# Patient Record
Sex: Male | Born: 1950 | Hispanic: No | Smoking: Never smoker
Health system: Southern US, Community
[De-identification: ages and names within clinical notes are randomized; demographics above are authoritative.]

## PROBLEM LIST (undated history)

## (undated) ENCOUNTER — Emergency Department (HOSPITAL_COMMUNITY): Admission: EM | Payer: Self-pay

## (undated) DIAGNOSIS — I639 Cerebral infarction, unspecified: Secondary | ICD-10-CM

---

## 2021-02-21 ENCOUNTER — Encounter (HOSPITAL_COMMUNITY): Payer: Self-pay | Admitting: Emergency Medicine

## 2021-02-21 ENCOUNTER — Inpatient Hospital Stay (HOSPITAL_COMMUNITY)
Admission: EM | Admit: 2021-02-21 | Discharge: 2021-02-25 | DRG: 069 | Disposition: A | Discharge status: Home Health | Payer: Medicare Other | Attending: Internal Medicine | Admitting: Internal Medicine

## 2021-02-21 ENCOUNTER — Other Ambulatory Visit: Payer: Self-pay

## 2021-02-21 ENCOUNTER — Emergency Department (HOSPITAL_COMMUNITY): Payer: Medicare Other

## 2021-02-21 DIAGNOSIS — E440 Moderate protein-calorie malnutrition: Secondary | ICD-10-CM | POA: Insufficient documentation

## 2021-02-21 DIAGNOSIS — B20 Human immunodeficiency virus [HIV] disease: Secondary | ICD-10-CM

## 2021-02-21 DIAGNOSIS — I639 Cerebral infarction, unspecified: Principal | ICD-10-CM

## 2021-02-21 DIAGNOSIS — R2990 Unspecified symptoms and signs involving the nervous system: Secondary | ICD-10-CM | POA: Diagnosis present

## 2021-02-21 DIAGNOSIS — G459 Transient cerebral ischemic attack, unspecified: Secondary | ICD-10-CM

## 2021-02-21 DIAGNOSIS — E041 Nontoxic single thyroid nodule: Secondary | ICD-10-CM

## 2021-02-21 DIAGNOSIS — I10 Essential (primary) hypertension: Secondary | ICD-10-CM | POA: Diagnosis present

## 2021-02-21 DIAGNOSIS — Z79899 Other long term (current) drug therapy: Secondary | ICD-10-CM

## 2021-02-21 DIAGNOSIS — I69354 Hemiplegia and hemiparesis following cerebral infarction affecting left non-dominant side: Secondary | ICD-10-CM

## 2021-02-21 DIAGNOSIS — Z7901 Long term (current) use of anticoagulants: Secondary | ICD-10-CM

## 2021-02-21 DIAGNOSIS — I951 Orthostatic hypotension: Secondary | ICD-10-CM | POA: Diagnosis present

## 2021-02-21 DIAGNOSIS — Z21 Asymptomatic human immunodeficiency virus [HIV] infection status: Secondary | ICD-10-CM | POA: Diagnosis present

## 2021-02-21 DIAGNOSIS — Z20822 Contact with and (suspected) exposure to covid-19: Secondary | ICD-10-CM | POA: Diagnosis present

## 2021-02-21 DIAGNOSIS — E042 Nontoxic multinodular goiter: Secondary | ICD-10-CM | POA: Diagnosis present

## 2021-02-21 DIAGNOSIS — E43 Unspecified severe protein-calorie malnutrition: Secondary | ICD-10-CM | POA: Diagnosis present

## 2021-02-21 DIAGNOSIS — Z6822 Body mass index (BMI) 22.0-22.9, adult: Secondary | ICD-10-CM

## 2021-02-21 DIAGNOSIS — R29810 Facial weakness: Secondary | ICD-10-CM | POA: Diagnosis present

## 2021-02-21 DIAGNOSIS — R1310 Dysphagia, unspecified: Secondary | ICD-10-CM | POA: Diagnosis present

## 2021-02-21 DIAGNOSIS — I480 Paroxysmal atrial fibrillation: Secondary | ICD-10-CM | POA: Diagnosis present

## 2021-02-21 DIAGNOSIS — Z7982 Long term (current) use of aspirin: Secondary | ICD-10-CM

## 2021-02-21 DIAGNOSIS — R270 Ataxia, unspecified: Secondary | ICD-10-CM | POA: Diagnosis present

## 2021-02-21 HISTORY — DX: Cerebral infarction, unspecified: I63.9

## 2021-02-21 LAB — COMPREHENSIVE METABOLIC PANEL
ALT: 23 U/L (ref 0–44)
AST: 37 U/L (ref 15–41)
Albumin: 3.9 g/dL (ref 3.5–5.0)
Alkaline Phosphatase: 71 U/L (ref 38–126)
Anion gap: 7 (ref 5–15)
BUN: 23 mg/dL (ref 8–23)
CO2: 24 mmol/L (ref 22–32)
Calcium: 9.2 mg/dL (ref 8.9–10.3)
Chloride: 106 mmol/L (ref 98–111)
Creatinine, Ser: 1.25 mg/dL — ABNORMAL HIGH (ref 0.61–1.24)
GFR, Estimated: >60 mL/min (ref >60)
Glucose, Bld: 192 mg/dL — ABNORMAL HIGH (ref 70–99)
Potassium: 4.2 mmol/L (ref 3.5–5.1)
Sodium: 137 mmol/L (ref 135–145)
Total Bilirubin: 1 mg/dL (ref 0.3–1.2)
Total Protein: 7.7 g/dL (ref 6.5–8.1)

## 2021-02-21 LAB — I-STAT CHEM 8, ED
BUN: 27 mg/dL — ABNORMAL HIGH (ref 8–23)
Calcium, Ion: 1.1 mmol/L — ABNORMAL LOW (ref 1.15–1.40)
Chloride: 107 mmol/L (ref 98–111)
Creatinine, Ser: 1 mg/dL (ref 0.61–1.24)
Glucose, Bld: 193 mg/dL — ABNORMAL HIGH (ref 70–99)
HCT: 47 % (ref 39.0–52.0)
Hemoglobin: 16 g/dL (ref 13.0–17.0)
Potassium: 4.4 mmol/L (ref 3.5–5.1)
Sodium: 141 mmol/L (ref 135–145)
TCO2: 24 mmol/L (ref 22–32)

## 2021-02-21 LAB — CBC
HCT: 45.5 % (ref 39.0–52.0)
Hemoglobin: 14.7 g/dL (ref 13.0–17.0)
MCH: 33.9 pg (ref 26.0–34.0)
MCHC: 32.3 g/dL (ref 30.0–36.0)
MCV: 105.1 fL — ABNORMAL HIGH (ref 80.0–100.0)
Platelets: 193 10*3/uL (ref 150–400)
RBC: 4.33 MIL/uL (ref 4.22–5.81)
RDW: 12.9 % (ref 11.5–15.5)
WBC: 4.7 10*3/uL (ref 4.0–10.5)
nRBC: 0 % (ref 0.0–0.2)

## 2021-02-21 LAB — APTT: aPTT: 34 seconds (ref 24–36)

## 2021-02-21 LAB — DIFFERENTIAL
Abs Immature Granulocytes: 0.01 10*3/uL (ref 0.00–0.07)
Basophils Absolute: 0 10*3/uL (ref 0.0–0.1)
Basophils Relative: 1 %
Eosinophils Absolute: 0.1 10*3/uL (ref 0.0–0.5)
Eosinophils Relative: 1 %
Immature Granulocytes: 0 %
Lymphocytes Relative: 24 %
Lymphs Abs: 1.1 10*3/uL (ref 0.7–4.0)
Monocytes Absolute: 0.4 10*3/uL (ref 0.1–1.0)
Monocytes Relative: 9 %
Neutro Abs: 3.1 10*3/uL (ref 1.7–7.7)
Neutrophils Relative %: 65 %

## 2021-02-21 LAB — CBG MONITORING, ED
Glucose-Capillary: 178 mg/dL — ABNORMAL HIGH (ref 70–99)
Glucose-Capillary: 190 mg/dL — ABNORMAL HIGH (ref 70–99)

## 2021-02-21 LAB — RESP PANEL BY RT-PCR (FLU A&B, COVID) ARPGX2
Influenza A by PCR: NEGATIVE
Influenza B by PCR: NEGATIVE
SARS Coronavirus 2 by RT PCR: NEGATIVE

## 2021-02-21 LAB — PROTIME-INR
INR: 1.5 — ABNORMAL HIGH (ref 0.8–1.2)
Prothrombin Time: 17.6 seconds — ABNORMAL HIGH (ref 11.4–15.2)

## 2021-02-21 IMAGING — CT CT HEAD CODE STROKE
4 series · 16 of 47 positions shown, 18 images · non-contrast
Comparison: None.

CLINICAL DATA: Code stroke.  Left arm, right face

EXAM:
CT HEAD WITHOUT CONTRAST
TECHNIQUE: Contiguous axial images were obtained from the base of the skull
through the vertex without intravenous contrast.

[Series 2: head wo · axial · 0.40mm/px · z∈[-124,-4]mm · 7 of 33 slices shown, 9 images]
[im 5/33  brain]
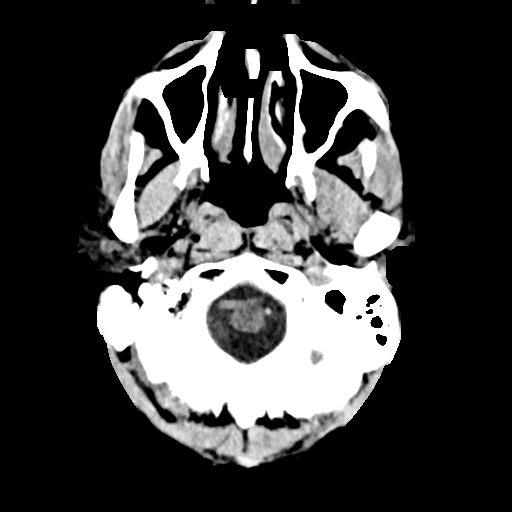
[im 5/33  bone]
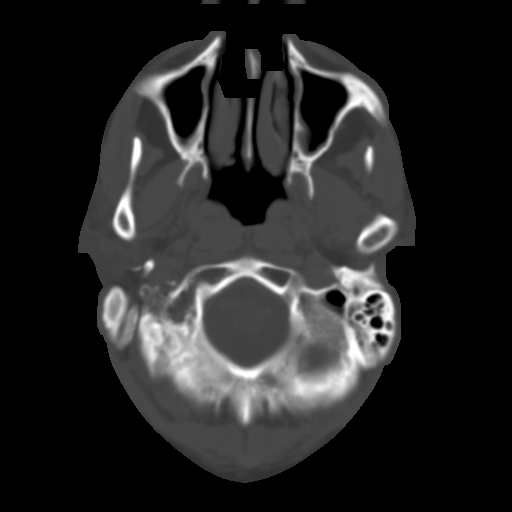
[im 9/33  brain]
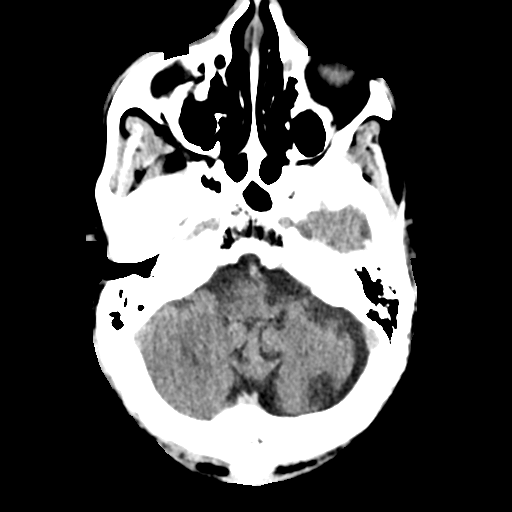
[im 13/33  brain]
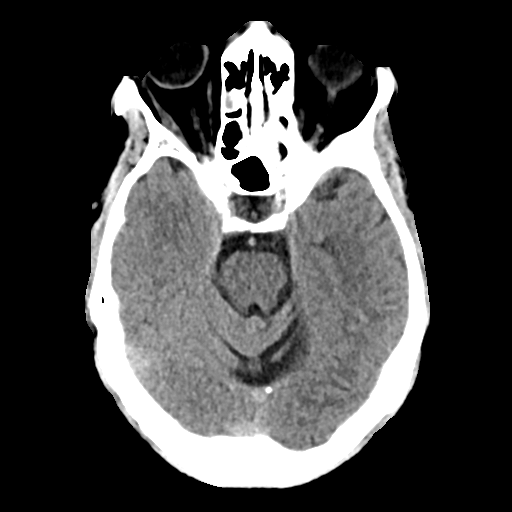
[im 17/33  brain]
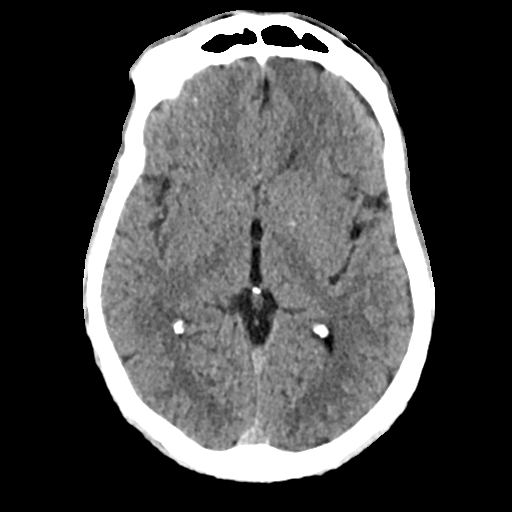
[im 21/33  brain]
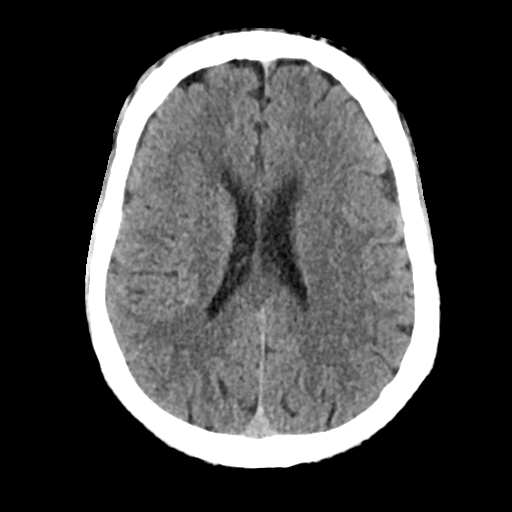
[im 21/33  bone]
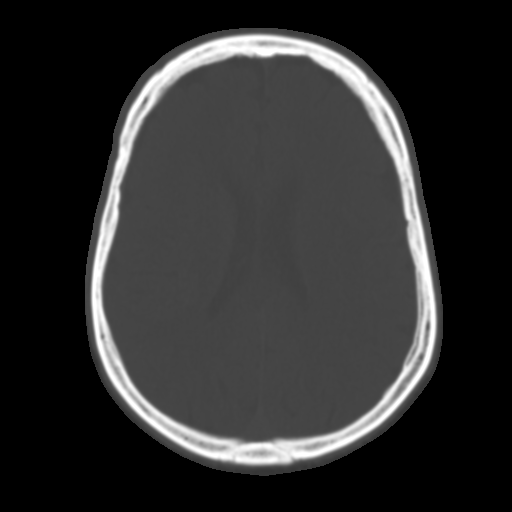
[im 25/33  brain]
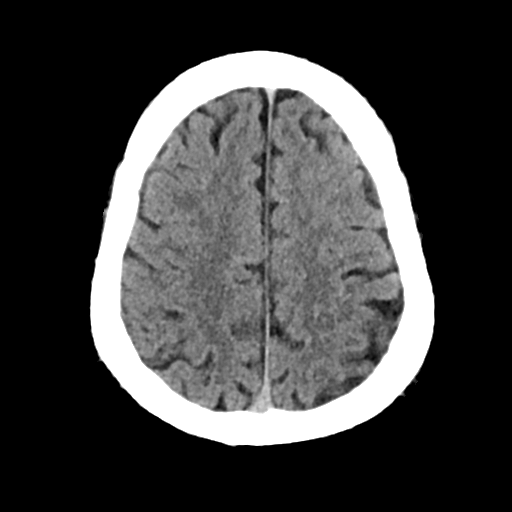
[im 29/33  brain]
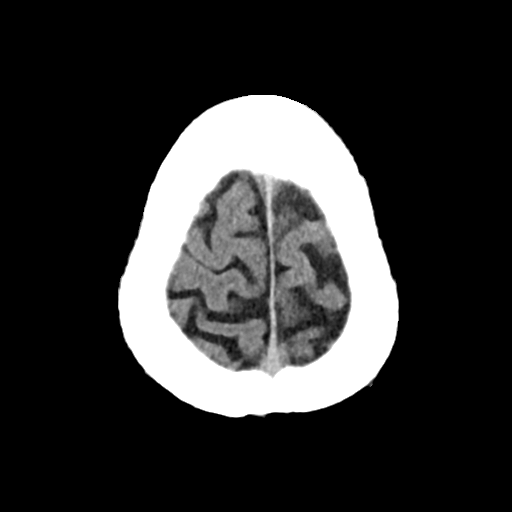

[Series 4: head bone · axial · 0.40mm/px · z∈[-128,-96]mm · 3 of 82 slices shown]
[im 9/82  bone]
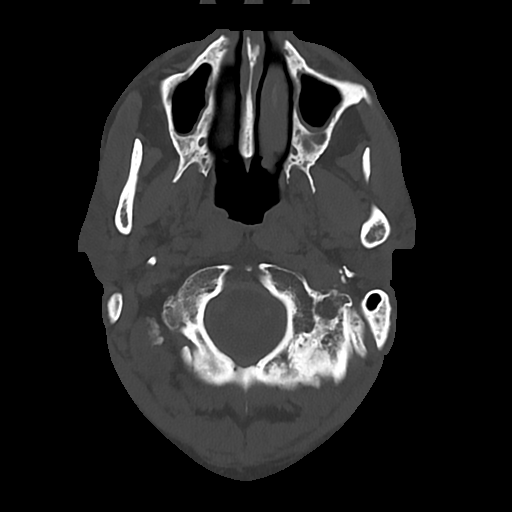
[im 17/82  bone]
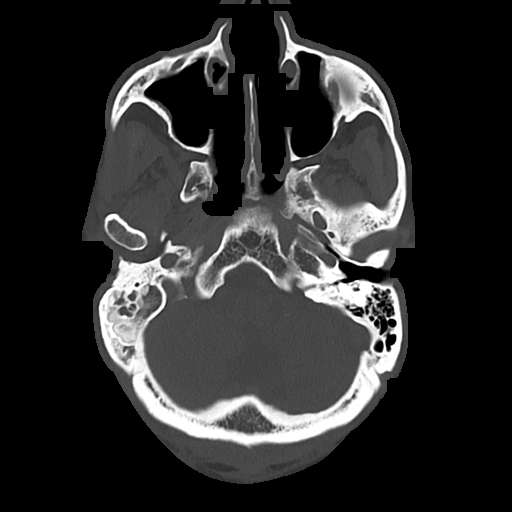
[im 25/82  bone]
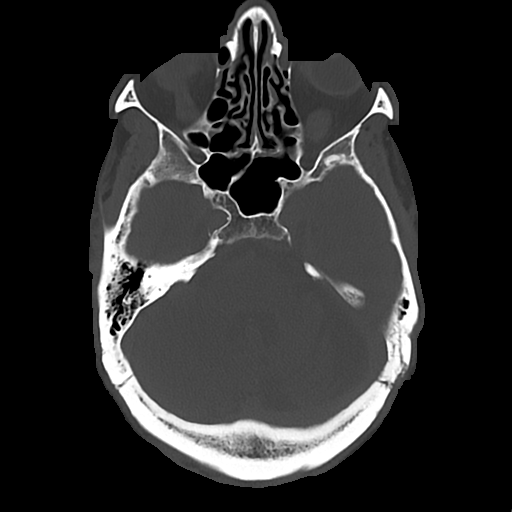

[Series 5: cor soft · coronal · 0.33mm/px · 3 of 73 slices shown]
[im 25/73  brain]
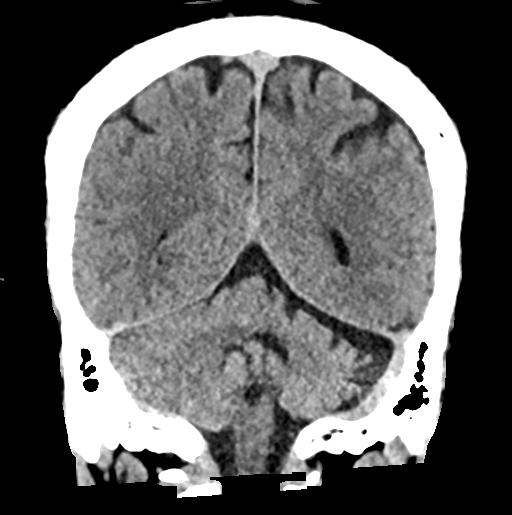
[im 33/73  brain]
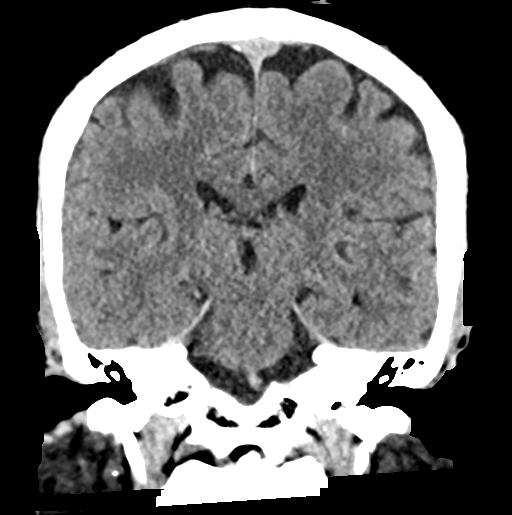
[im 41/73  brain]
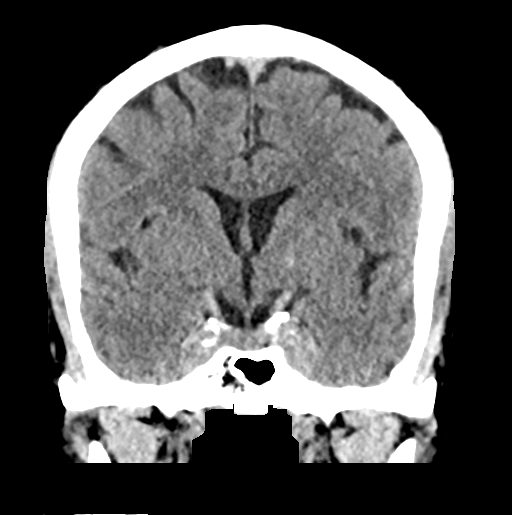

[Series 6: sag soft · sagittal · 0.33mm/px · 3 of 55 slices shown]
[im 19/55  brain]
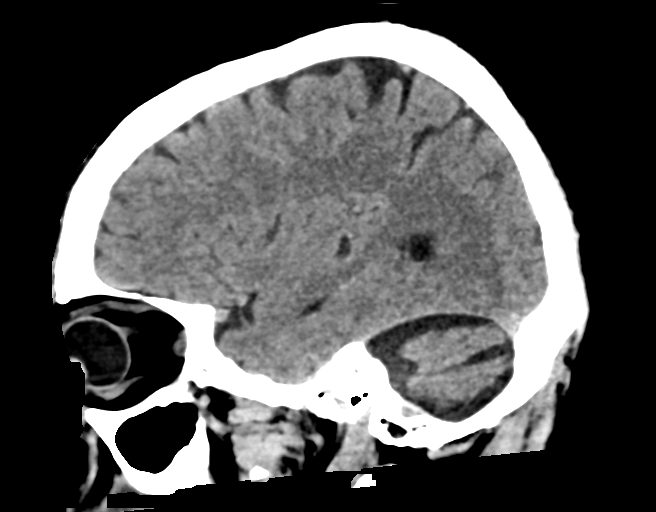
[im 28/55  brain]
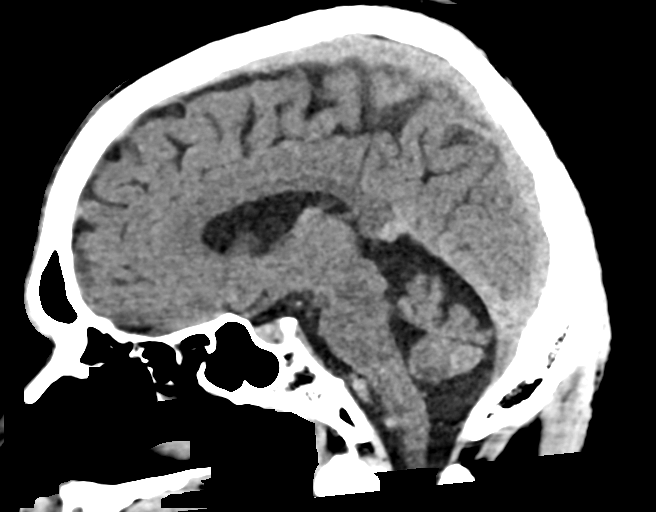
[im 37/55  brain]
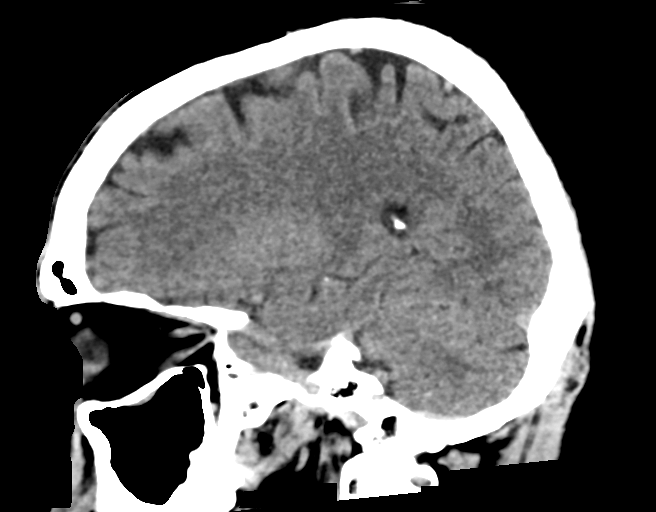

[16 of 47 positions shown; findings below may reference images not displayed]

FINDINGS: Brain: No evidence of acute infarction, hemorrhage, cerebral edema,
mass, mass effect, or midline shift. Ventricles and sulci are normal
for age. No extra-axial fluid collection.

Vascular: No hyperdense vessel or unexpected calcification.

Skull: Normal. Negative for fracture or focal lesion.

Sinuses/Orbits: Mucosal thickening in the ethmoid air cells. The
orbits are unremarkable.

Other: None.

ASPECTS ([place] Stroke Program Early CT Score)

- Ganglionic level infarction (caudate, lentiform nuclei, internal
capsule, insula, M1-M3 cortex): 7

- Supraganglionic infarction (M4-M6 cortex): 3

Total score (0-10 with 10 being normal): 10
IMPRESSION: 1. No acute intracranial process.
2. ASPECTS is 10.

Code stroke imaging results were communicated on [DATE] at [DATE] to provider RRULI via secure text paging.

## 2021-02-21 MED ORDER — SODIUM CHLORIDE 0.9% FLUSH
3.0000 mL | Freq: Once | INTRAVENOUS | Status: AC
Start: 2021-02-21 — End: 2021-02-21
  Administered 2021-02-21: 3 mL via INTRAVENOUS

## 2021-02-21 NOTE — ED Provider Notes (Signed)
[facility] EMERGENCY DEPARTMENT Provider Note   CSN: [id number] Arrival date & time: 02/21/21  1858  An emergency department physician performed an initial assessment on this suspected stroke patient at 1858.  History Chief Complaint  Patient presents with   Code Stroke    Zachary Tucker is a 70 y.o. male.  Patient last known normal was 1800.  Patient is left-handed.  Patient was on a conference call when he noted that the symptoms.  He had right facial droop difficulty speaking and loss of coordination.  Attendees stated that there was right facial droop.  EMS was called.  On arrival here patient was showing significant improvement.  There was no signs of aphasia.  Facial droop had cleared.  There was some coordination very subtle problems in the left upper extremity.  Patient arrived as a code stroke and was seen by the stroke team.  Including Dr. Thomasena Edis.      Past Medical History:  Diagnosis Date   Stroke [facility])     There are no problems to display for this patient.   History reviewed. No pertinent surgical history.     No family history on file.  Social History   Tobacco Use   Smoking status: Never   Smokeless tobacco: Never  Substance Use Topics   Alcohol use: Never   Drug use: Never    Home Medications Prior to Admission medications   Not on File    Allergies    Patient has no known allergies.  Review of Systems   Review of Systems  Constitutional:  Negative for chills and fever.  HENT:  Negative for ear pain and sore throat.   Eyes:  Negative for pain and visual disturbance.  Respiratory:  Negative for cough and shortness of breath.   Cardiovascular:  Negative for chest pain and palpitations.  Gastrointestinal:  Negative for abdominal pain and vomiting.  Genitourinary:  Negative for dysuria and hematuria.  Musculoskeletal:  Negative for arthralgias and back pain.  Skin:  Negative for color change and rash.  Neurological:   Positive for facial asymmetry. Negative for seizures and syncope.  All other systems reviewed and are negative.  Physical Exam Updated Vital Signs BP 130/90   Pulse (!) 58   Temp 98.1 F (36.7 C) (Temporal)   Resp 16   Ht 1.956 m (6\' 5" )   Wt 86 kg   SpO2 98%   BMI 22.48 kg/m   Physical Exam Vitals and nursing note reviewed.  Constitutional:      Appearance: Normal appearance. He is well-developed.  HENT:     Head: Normocephalic and atraumatic.  Eyes:     Extraocular Movements: Extraocular movements intact.     Conjunctiva/sclera: Conjunctivae normal.     Pupils: Pupils are equal, round, and reactive to light.  Cardiovascular:     Rate and Rhythm: Normal rate and regular rhythm.     Heart sounds: No murmur heard. Pulmonary:     Effort: Pulmonary effort is normal. No respiratory distress.     Breath sounds: Normal breath sounds.  Abdominal:     Palpations: Abdomen is soft.     Tenderness: There is no abdominal tenderness.  Musculoskeletal:        General: Normal range of motion.     Cervical back: Neck supple.  Skin:    General: Skin is warm and dry.  Neurological:     Mental Status: He is alert and oriented to person, place, and time.  Cranial Nerves: No cranial nerve deficit.     Sensory: No sensory deficit.     Motor: No weakness.     Coordination: Coordination abnormal.    ED Results / Procedures / Treatments   Labs (all labs ordered are listed, but only abnormal results are displayed) Labs Reviewed  PROTIME-INR - Abnormal; Notable for the following components:      Result Value   Prothrombin Time 17.6 (*)    INR 1.5 (*)    All other components within normal limits  CBC - Abnormal; Notable for the following components:   MCV 105.1 (*)    All other components within normal limits  COMPREHENSIVE METABOLIC PANEL - Abnormal; Notable for the following components:   Glucose, Bld 192 (*)    Creatinine, Ser 1.25 (*)    All other components within normal  limits  I-STAT CHEM 8, ED - Abnormal; Notable for the following components:   BUN 27 (*)    Glucose, Bld 193 (*)    Calcium, Ion 1.10 (*)    All other components within normal limits  CBG MONITORING, ED - Abnormal; Notable for the following components:   Glucose-Capillary 190 (*)    All other components within normal limits  CBG MONITORING, ED - Abnormal; Notable for the following components:   Glucose-Capillary 178 (*)    All other components within normal limits  RESP PANEL BY RT-PCR (FLU A&B, COVID) ARPGX2  APTT  DIFFERENTIAL    EKG EKG Interpretation  Date/Time:  Thursday February 21 2021 19:26:29 EDT Ventricular Rate:  63 PR Interval:  175 QRS Duration: 88 QT Interval:  422 QTC Calculation: 432 R Axis:   -10 Text Interpretation: Sinus rhythm ST elevation, consider inferior injury No previous ECGs available Confirmed by Vanetta Mulders [phone]) on 02/21/2021 10:12:37 PM  Radiology CT HEAD CODE STROKE WO CONTRAST  Result Date: 02/21/2021 CLINICAL DATA:  Code stroke.  Left arm, right face EXAM: CT HEAD WITHOUT CONTRAST TECHNIQUE: Contiguous axial images were obtained from the base of the skull through the vertex without intravenous contrast. COMPARISON:  None. FINDINGS: Brain: No evidence of acute infarction, hemorrhage, cerebral edema, mass, mass effect, or midline shift. Ventricles and sulci are normal for age. No extra-axial fluid collection. Vascular: No hyperdense vessel or unexpected calcification. Skull: Normal. Negative for fracture or focal lesion. Sinuses/Orbits: Mucosal thickening in the ethmoid air cells. The orbits are unremarkable. Other: None. ASPECTS [facility] Stroke Program Early CT Score) - Ganglionic level infarction (caudate, lentiform nuclei, internal capsule, insula, M1-M3 cortex): 7 - Supraganglionic infarction (M4-M6 cortex): 3 Total score (0-10 with 10 being normal): 10 IMPRESSION: 1. No acute intracranial process. 2. ASPECTS is 10. Code stroke imaging  results were communicated on 02/21/2021 at 7:18 pm to provider [facility] via secure text paging. Electronically Signed   By: Wiliam Ke M.D.   On: 02/21/2021 19:19    Procedures Procedures   Medications Ordered in ED Medications  sodium chloride flush (NS) 0.9 % injection 3 mL (3 mLs Intravenous Given 02/21/21 1931)    ED Course  I have reviewed the triage vital signs and the nursing notes.  Pertinent labs & imaging results that were available during my care of the patient were reviewed by me and considered in my medical decision making (see chart for details).    MDM Rules/Calculators/A&P                         CRITICAL CARE Performed by: Lorin Picket  Decoda Van Total critical care time: 45 minutes Critical care time was exclusive of separately billable procedures and treating other patients. Critical care was necessary to treat or prevent imminent or life-threatening deterioration. Critical care was time spent personally by me on the following activities: development of treatment plan with patient and/or surrogate as well as nursing, discussions with consultants, evaluation of patient's response to treatment, examination of patient, obtaining history from patient or surrogate, ordering and performing treatments and interventions, ordering and review of laboratory studies, ordering and review of radiographic studies, pulse oximetry and re-evaluation of patient's condition.   Patient now with resolution of all of his symptoms except for may be some coordination abnormalities in the upper extremity.  Seen by stroke team.  They recommend medical admission MRI MRA head and neck.  These have been ordered.  Discussed with the hospitalist service they will admit.  Since initial COVID testing and influenza testing negative.  Glucose was 178.  CBC without acute abnormalities.  Complete metabolic panel creatinine was 1.63.  CT head without any acute process.    Final Clinical Impression(s) / ED  Diagnoses Final diagnoses:  Cerebrovascular accident (CVA), unspecified mechanism [facility])    Rx / DC Orders ED Discharge Orders     None        Vanetta Mulders, MD 02/21/21 2322

## 2021-02-21 NOTE — Consult Note (Signed)
Neurology Consult H&P  Zachary Tucker MR# [id number] 02/21/2021   CC: R facial droop, difficulty speaking, and loss of coordination. Pt was on a conference call when he was noted to have these symptoms and EMS was called.  History is obtained from: Patient and chart.  HPI: Zachary Tucker is a left-handed 70 y.o. male PMHx as reviewed below was on a conference call and when the attendees noted right-sided facial droop and called EMS.  Reviewed his medical record shows that he is on aspirin 81 and was on apixaban which was temporarily stopped for dental procedure and restarted this morning.  He took his morning dose.  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  LKW: 1800 tNK given: No not a candidate IR Thrombectomy No, no LVO Modified Rankin Scale: 0-Completely asymptomatic and back to baseline post- stroke NIHSS: 2  ROS: A complete ROS was performed and is negative except as noted in the HPI.   No past medical history on file.   No family history on file.  Social History:  has no history on file for tobacco use, alcohol use, and drug use.   Prior to Admission medications   Not on File    Exam: Current vital signs: There were no vitals taken for this visit.  Physical Exam  Constitutional: Appears well-developed and well-nourished.  Psych: Affect appropriate to situation Eyes: No scleral injection HENT: No OP obstruction. Head: Normocephalic.  Cardiovascular: Normal rate and regular rhythm.  Respiratory: Effort normal, symmetric excursions bilaterally, no audible wheezing. GI: Soft.  No distension. There is no tenderness.  Skin: WDI  Neuro: Mental Status: Patient is awake, alert, oriented to person, place, month, year, and situation. Patient is able to give a clear and coherent history. Speech fluent, intact comprehension and repetition. No signs of aphasia or neglect. Visual Fields are full. Pupils  are equal, round, and reactive to light. EOMI without ptosis or diploplia.  Facial sensation is symmetric to temperature Facial movement is symmetric.  Hearing is intact to voice. Uvula midline and palate elevates symmetrically. Shoulder shrug is symmetric. Tongue is midline without atrophy or fasciculations.  Tone is normal. Bulk is normal. 5/5 strength was present in all four extremities. Sensation mild decreased to light touch in the larm. Deep Tendon Reflexes: 2+ and symmetric in the biceps and patellae. Toes are downgoing bilaterally. FNF mildly ataxic in the left upper extremity. HKS are intact bilaterally. Gait - Deferred  I have reviewed labs in epic and the pertinent results are: None available at the time of evaluation  I have reviewed the images obtained: NCT head showed no acute ischemic changes hemorrhage or mass, aspects 10  Assessment: Zachary Tucker is a left-handed 70 y.o. male PMHx as noted above on aspirin and apixaban with acute onset and transient mild right facial droop and incoordination on the left.  On exam he does not have facial droop and there is no aphasia.  He does have mild ataxia in the left upper extremity.  He was not a candidate for tNK.  Plan: - MRI brain without contrast. - Recommend vascular imaging with MRA head and neck. - Recommend TTE. - Recommend labs: HbA1c, lipid panel, TSH. - Recommend Statin if LDL > 70 -He is on aspirin 81mg  daily presumably from cardiology. - Continue apixaban - Permissive hypertension first 24 h < 220/110.  - Telemetry monitoring for arrhythmia. - Recommend bedside Swallow screen. - Recommend Stroke education. -  Recommend PT/OT/SLP consult.  This patient is critically ill and at significant risk of neurological worsening, death and care requires constant monitoring of vital signs, hemodynamics,respiratory and cardiac monitoring, neurological assessment, discussion with family, other specialists and medical decision  making of high complexity. I spent 73 minutes of neurocritical care time  in the care of  this patient. This was time spent independent of any time provided by nurse practitioner or PA.  Electronically signed by:  Marisue Humble, MD Page: [phone] 02/21/2021, 7:21 PM

## 2021-02-21 NOTE — ED Triage Notes (Signed)
Patient arrived with EMS from home code stroke status , LSN  today at 6 pm with sudden onset right facial droop with left arm weakness and expressive aphasia .

## 2021-02-21 NOTE — Code Documentation (Addendum)
Responded to Code Stroke called at 1839 for L sided weakness, R facial droop, difficulty speaking, and loss of coordination, LSN-1800. Pt was on a conference call when he was noted to have these symptoms and EMS was called. Pt arrived to ED at 1858, CBG-190, NIH-2 for ataxia and sensory deficits, CTH-negative for acute changes. TNK not given-pt on eliquis. Per pt, eliquis had been stopped for short time for a dental surgery. Pt restarted it today. Plan for MRI.

## 2021-02-22 ENCOUNTER — Observation Stay (HOSPITAL_COMMUNITY): Payer: Medicare Other

## 2021-02-22 ENCOUNTER — Encounter (HOSPITAL_COMMUNITY): Payer: Self-pay | Admitting: Internal Medicine

## 2021-02-22 DIAGNOSIS — E440 Moderate protein-calorie malnutrition: Secondary | ICD-10-CM | POA: Insufficient documentation

## 2021-02-22 DIAGNOSIS — B20 Human immunodeficiency virus [HIV] disease: Secondary | ICD-10-CM

## 2021-02-22 DIAGNOSIS — G459 Transient cerebral ischemic attack, unspecified: Secondary | ICD-10-CM

## 2021-02-22 DIAGNOSIS — E041 Nontoxic single thyroid nodule: Secondary | ICD-10-CM

## 2021-02-22 DIAGNOSIS — I480 Paroxysmal atrial fibrillation: Secondary | ICD-10-CM

## 2021-02-22 DIAGNOSIS — Z21 Asymptomatic human immunodeficiency virus [HIV] infection status: Secondary | ICD-10-CM

## 2021-02-22 DIAGNOSIS — I951 Orthostatic hypotension: Secondary | ICD-10-CM | POA: Diagnosis not present

## 2021-02-22 DIAGNOSIS — Z8673 Personal history of transient ischemic attack (TIA), and cerebral infarction without residual deficits: Secondary | ICD-10-CM

## 2021-02-22 LAB — ECHOCARDIOGRAM COMPLETE
Area-P 1/2: 2.01 cm2
Height: 77 in
S' Lateral: 2.5 cm
Weight: 3033.53 oz

## 2021-02-22 LAB — RAPID URINE DRUG SCREEN, HOSP PERFORMED
Amphetamines: NOT DETECTED
Barbiturates: NOT DETECTED
Benzodiazepines: POSITIVE — AB
Cocaine: NOT DETECTED
Opiates: NOT DETECTED
Tetrahydrocannabinol: NOT DETECTED

## 2021-02-22 LAB — TSH: TSH: 2.135 u[IU]/mL (ref 0.350–4.500)

## 2021-02-22 LAB — LIPID PANEL
Cholesterol: 115 mg/dL (ref 0–200)
HDL: 33 mg/dL — ABNORMAL LOW (ref >40)
LDL Cholesterol: 69 mg/dL (ref 0–99)
Total CHOL/HDL Ratio: 3.5 RATIO
Triglycerides: 64 mg/dL (ref <150)
VLDL: 13 mg/dL (ref 0–40)

## 2021-02-22 IMAGING — MR MR MRA NECK WO/W CM
5 of 9 series · 35 of 48 positions shown · IV contrast (gadavist)
Comparison: Brain MRI and intracranial MRA today.

CLINICAL DATA: 64-year-old male code stroke presentation. Left arm
and right face deficits.

EXAM:
MRA NECK WITHOUT AND WITH CONTRAST
TECHNIQUE: Multiplanar and multiecho pulse sequences of the neck were obtained
without and with intravenous contrast. Angiographic images of the
neck were obtained using MRA technique without and with intravenous
contrast.
CONTRAST:  8mL GADAVIST GADOBUTROL 1 MMOL/ML IV SOLN

[Series 10: angio_fl3d_cor_pre_ttc=3.0s · coronal · 0.9mm · 0.88mm/px · 9 of 80 slices shown]
[im 1/80]
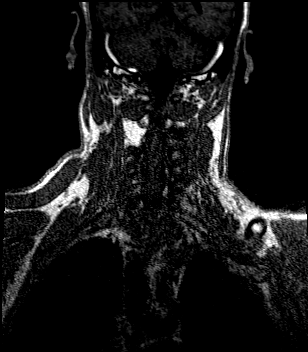
[im 10/80]
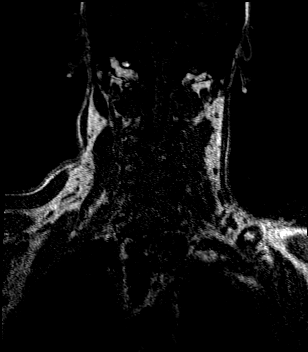
[im 20/80]
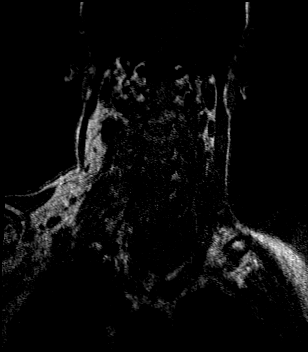
[im 30/80]
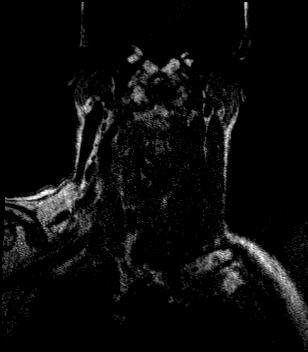
[im 40/80]
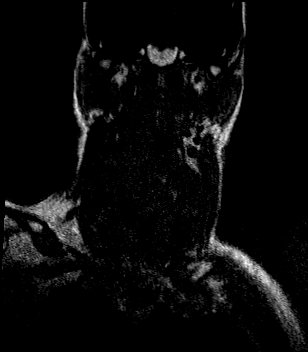
[im 50/80]
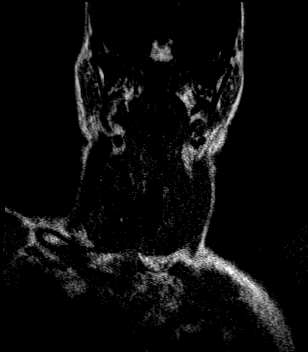
[im 60/80]
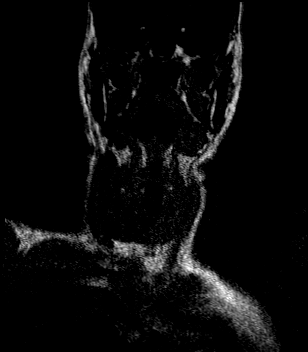
[im 70/80]
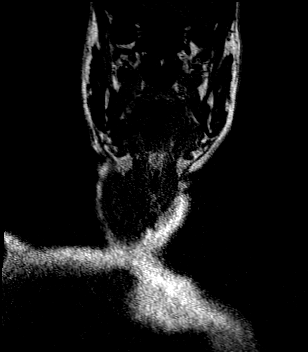
[im 80/80]
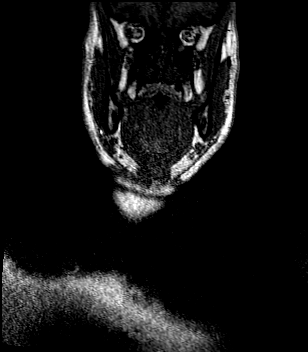

[Series 11: tof_fl3d_tra_iso · axial · 0.6mm · 0.52mm/px · z∈[-201,-122]mm · 16 of 133 slices shown]
[im 1/133]
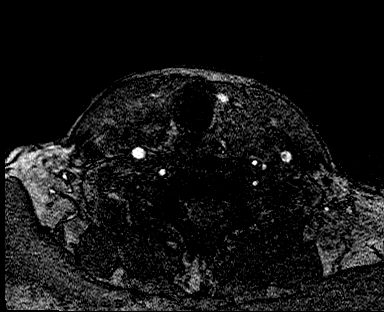
[im 9/133]
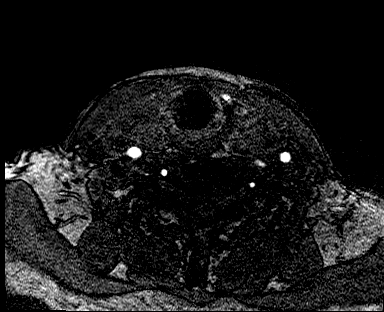
[im 18/133]
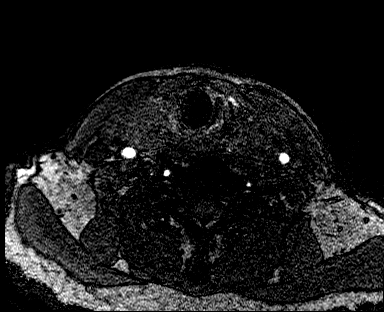
[im 27/133]
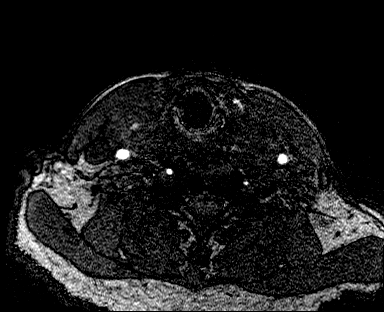
[im 36/133]
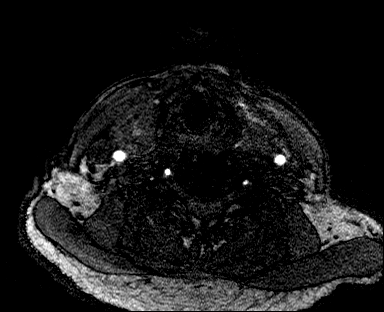
[im 45/133]
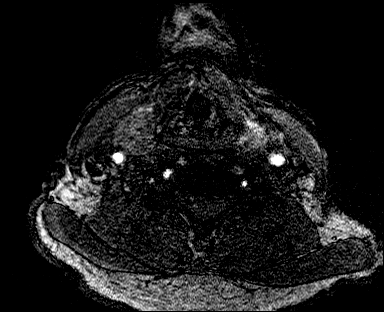
[im 53/133]
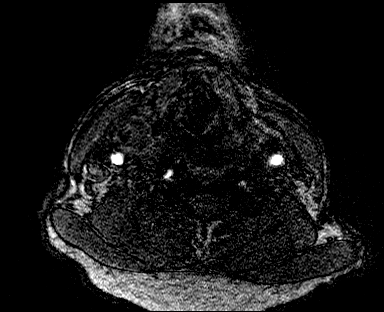
[im 62/133]
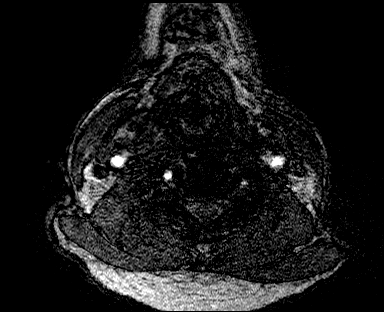
[im 71/133]
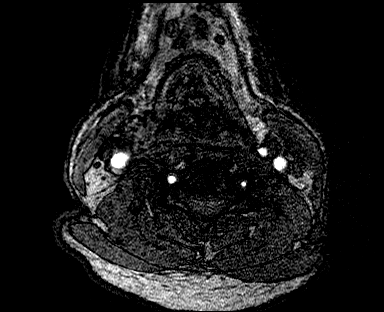
[im 80/133]
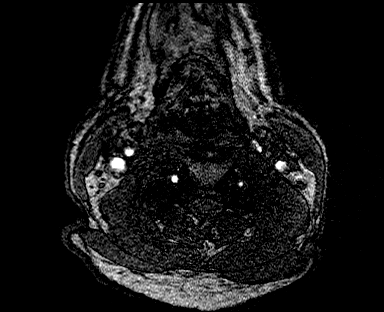
[im 89/133]
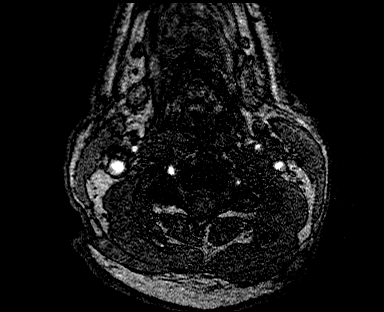
[im 97/133]
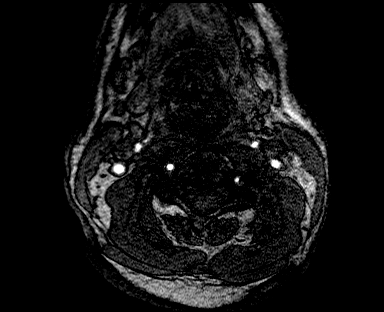
[im 106/133]
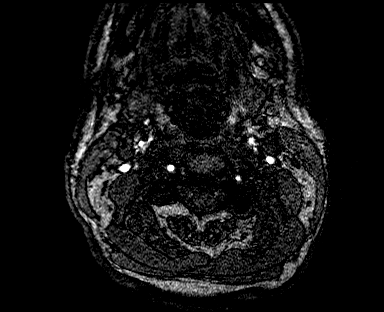
[im 115/133]
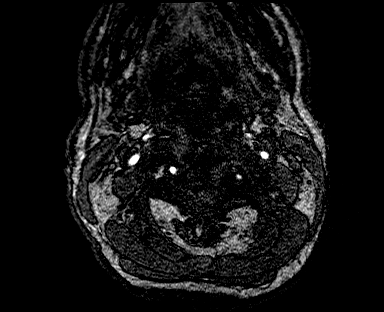
[im 124/133]
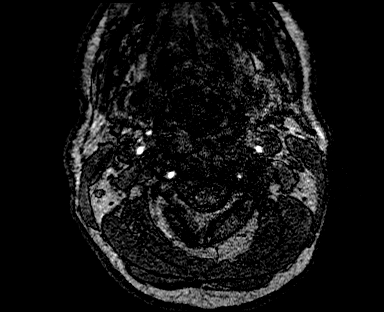
[im 133/133]
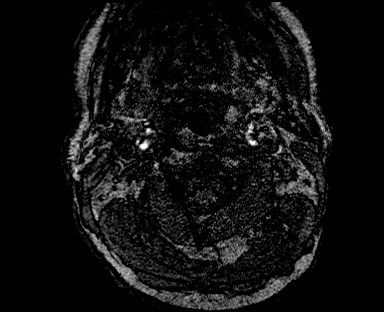

[Series 15: angio_fl3d_cor_post_ttc=3.0s · coronal · 0.9mm · 0.88mm/px · 8 of 80 slices shown]
[im 1/80]
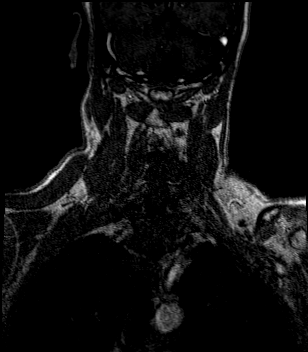
[im 9/80]
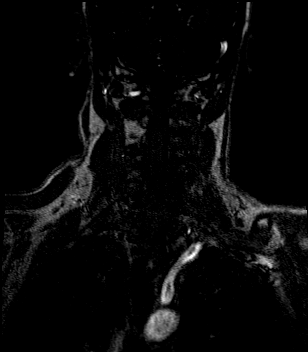
[im 27/80]
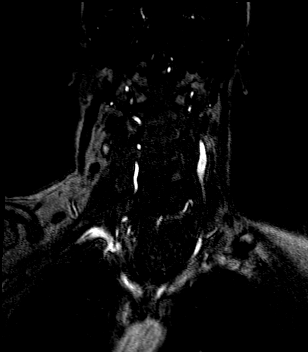
[im 36/80]
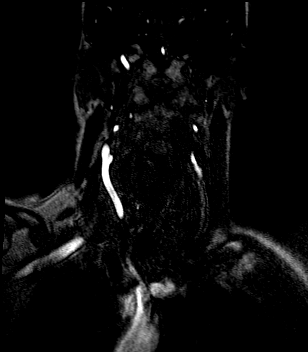
[im 44/80]
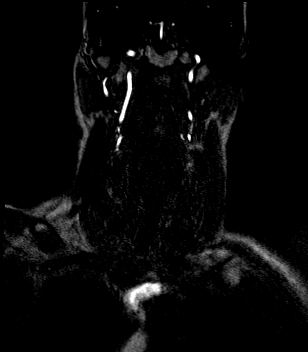
[im 53/80]
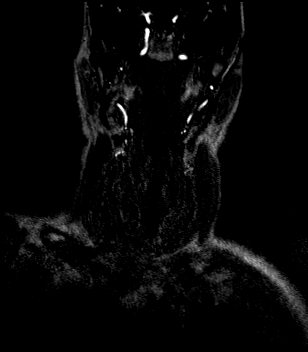
[im 71/80]
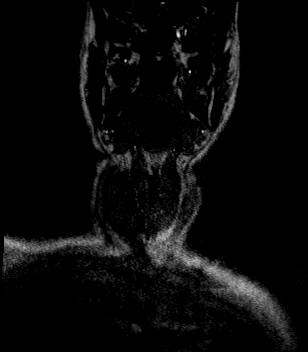
[im 80/80]
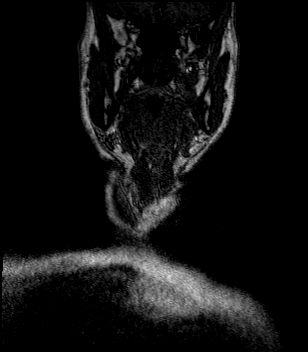

[Series 1003: rt bifur rotate · sagittal · 0.5mm · 0.20mm/px · 1 of 9 slices shown]
[im 1/9]
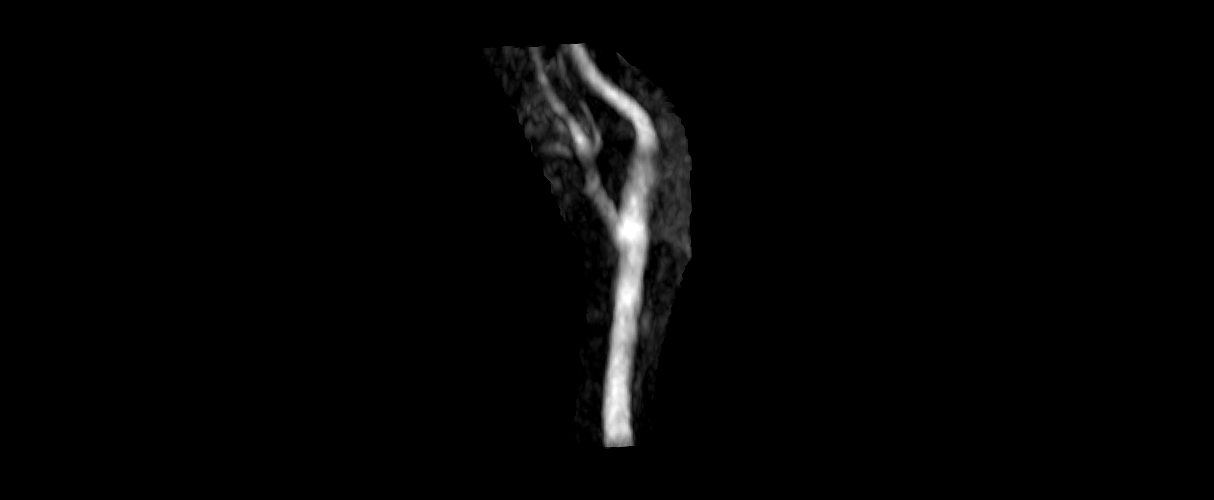

[Series 1009: lt bifur rotate · sagittal · 0.5mm · 0.20mm/px · 1 of 4 slices shown]
[im 1/4]
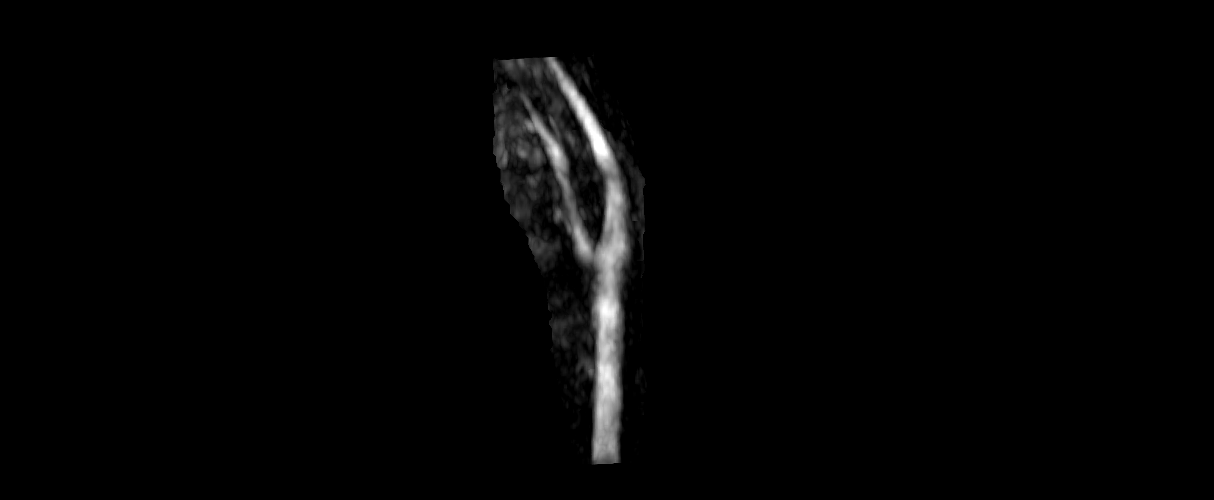

[35 of 48 positions shown; findings below may reference images not displayed]

FINDINGS: Precontrast time-of-flight images demonstrate antegrade flow signal
in the bilateral cervical carotid and vertebral arteries. The right
vertebral appears dominant. Carotid bifurcations appear grossly
normal.

Following contrast 3 vessel arch configuration is noted.

Right CCA is within normal limits. Right carotid bifurcation, right
ICA origin and bulb appear normal. Negative cervical right ICA, and
visible right ICA siphon, terminus.

Left CCA is within normal limits. Negative left carotid bifurcation,
cervical left ICA, and visible left ICA siphon. Prominent left
posterior communicating artery (normal variant).

Dominant right vertebral artery. Normal right vertebral artery
origin. The left vertebral has an early origin (normal variant)
which is not as well visualized. Both vertebrals are patent to the
skull base. No vertebral artery stenosis identified.
IMPRESSION: Negative MRA Neck.

## 2021-02-22 IMAGING — MR MR HEAD W/O CM
12 of 13 series · 44 of 48 positions shown · non-contrast
Comparison: Plain head CT [DATE]. [REDACTED]
[HOSPITAL] [HOSPITAL] Brain MRI [DATE].

CLINICAL DATA: 64-year-old male code stroke presentation. Left arm
and right face deficits.

EXAM:
MRI HEAD WITHOUT CONTRAST
TECHNIQUE: Multiplanar, multiecho pulse sequences of the brain and surrounding
structures were obtained without intravenous contrast.

[Series 5: DWI · axial · 3.0mm · 0.96mm/px · z∈[-104,+67]mm · 8 of 115 slices shown (1 of 4)]
[im 1/115]
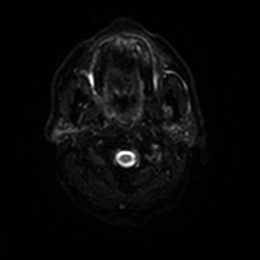
[im 17/115]
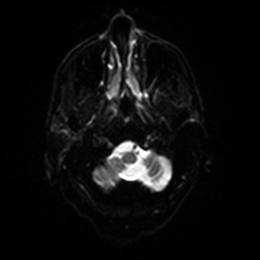
[im 33/115]
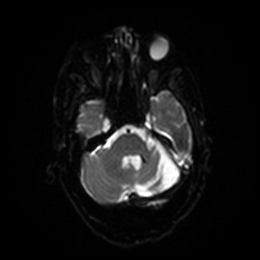
[im 49/115]
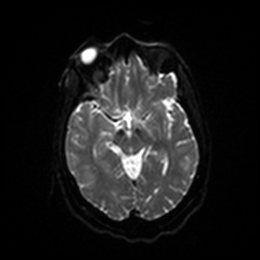
[im 66/115]
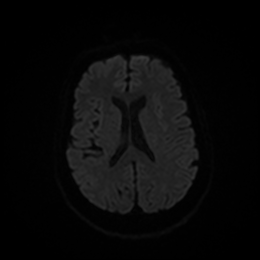
[im 82/115]
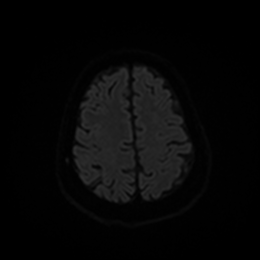
[im 98/115]
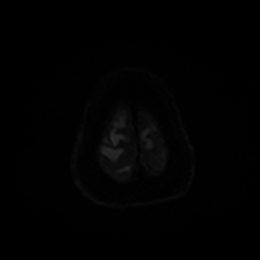
[im 115/115]
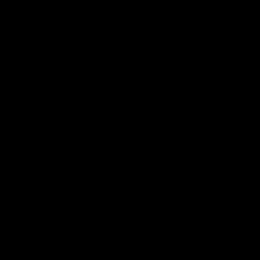

[Series 6: DWI · axial · 3.0mm · 0.96mm/px · z∈[-104,+64]mm · 4 of 55 slices shown (2 of 4)]
[im 1/55]
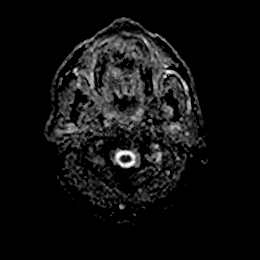
[im 19/55]
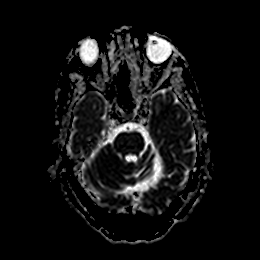
[im 37/55]
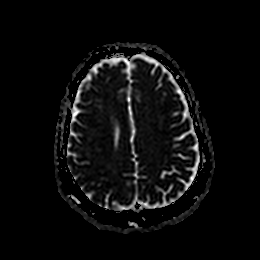
[im 55/55]
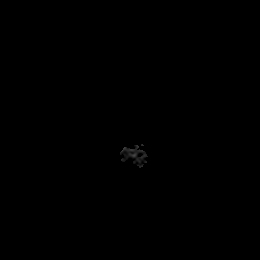

[Series 7: DWI · coronal · 4.0mm · 0.92mm/px · 6 of 80 slices shown (3 of 4)]
[im 1/80]
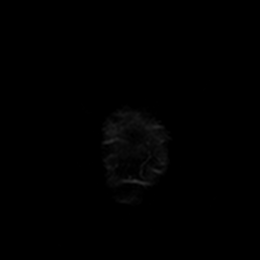
[im 16/80]
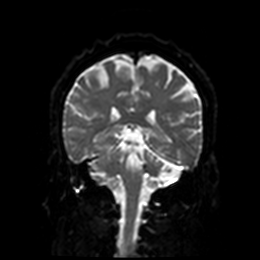
[im 32/80]
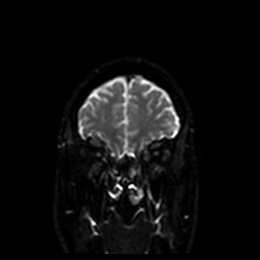
[im 48/80]
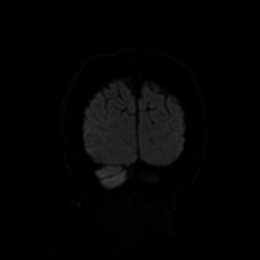
[im 64/80]
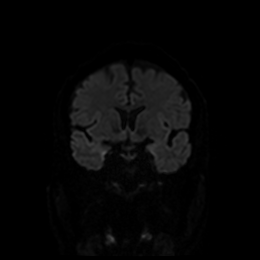
[im 80/80]
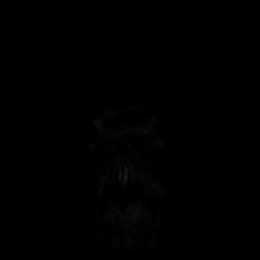

[Series 8: DWI · coronal · 4.0mm · 0.92mm/px · 3 of 40 slices shown (4 of 4)]
[im 1/40]
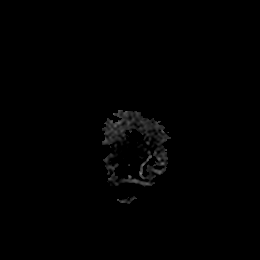
[im 20/40]
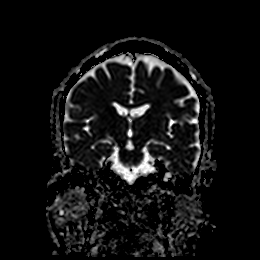
[im 40/40]
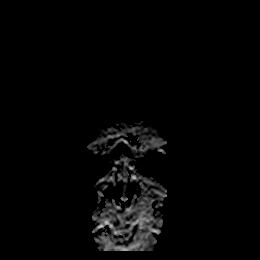

[Series 9: T1 · sagittal · 5.0mm · 0.78mm/px · 2 of 27 slices shown]
[im 1/27]
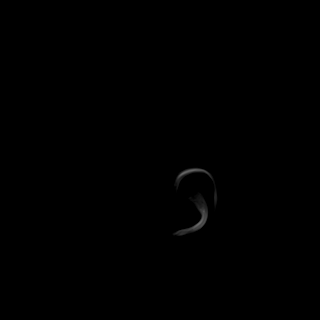
[im 27/27]
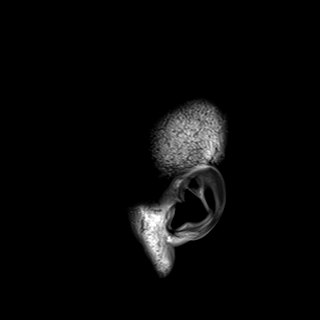

[Series 10: T2 · axial · 5.0mm · 0.75mm/px · z∈[-87,+80]mm · 2 of 29 slices shown (1 of 2)]
[im 1/29]
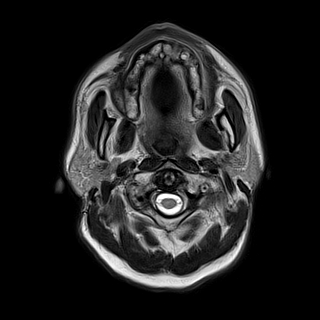
[im 29/29]
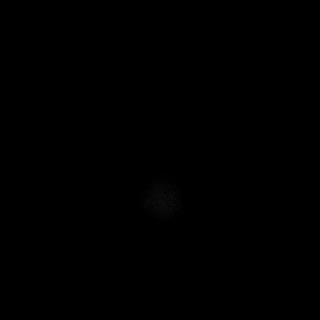

[Series 11: FLAIR · axial · 5.0mm · 0.45mm/px · z∈[-90,+76]mm · 2 of 29 slices shown]
[im 1/29]
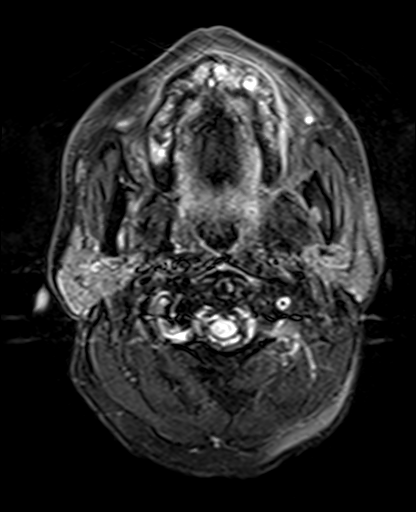
[im 29/29]
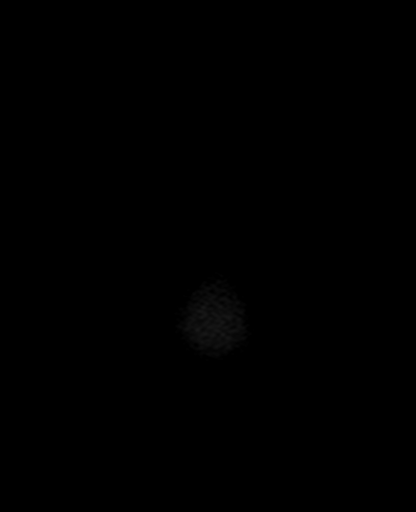

[Series 12: mag_images · axial · 3.0mm · 0.94mm/px · z∈[-88,+76]mm · 4 of 56 slices shown]
[im 1/56]
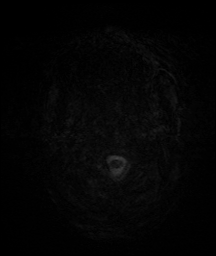
[im 19/56]
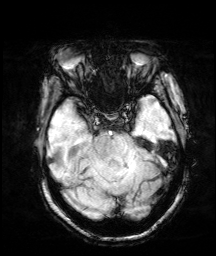
[im 37/56]
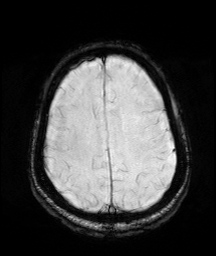
[im 56/56]
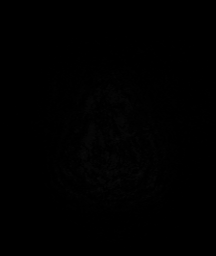

[Series 13: pha_images · axial · 3.0mm · 0.94mm/px · z∈[-88,+76]mm · 4 of 56 slices shown]
[im 1/56]
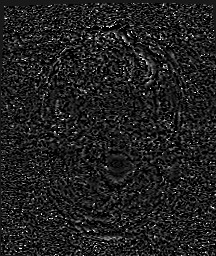
[im 19/56]
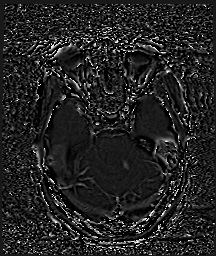
[im 37/56]
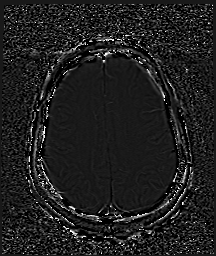
[im 56/56]
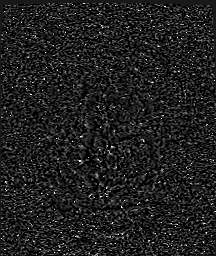

[Series 14: swi_images · axial · 3.0mm · 0.94mm/px · z∈[-88,+76]mm · 4 of 56 slices shown]
[im 1/56]
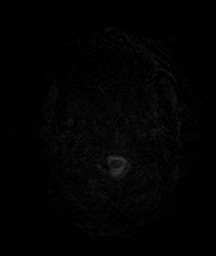
[im 19/56]
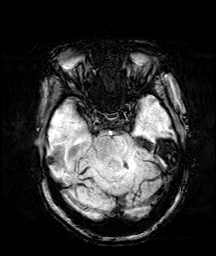
[im 37/56]
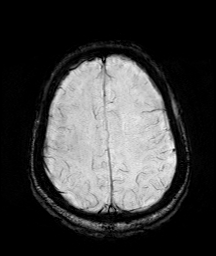
[im 56/56]
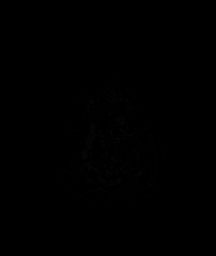

[Series 15: mip_images(sw) · axial · 24.0mm · 0.94mm/px · z∈[-77,+65]mm · 3 of 49 slices shown]
[im 1/49]
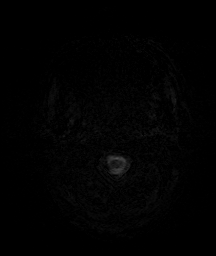
[im 25/49]
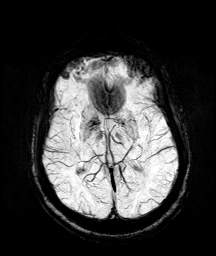
[im 49/49]
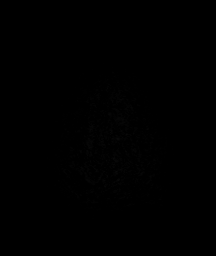

[Series 17: T2 · coronal · 5.0mm · 0.34mm/px · 2 of 34 slices shown (2 of 2)]
[im 1/34]
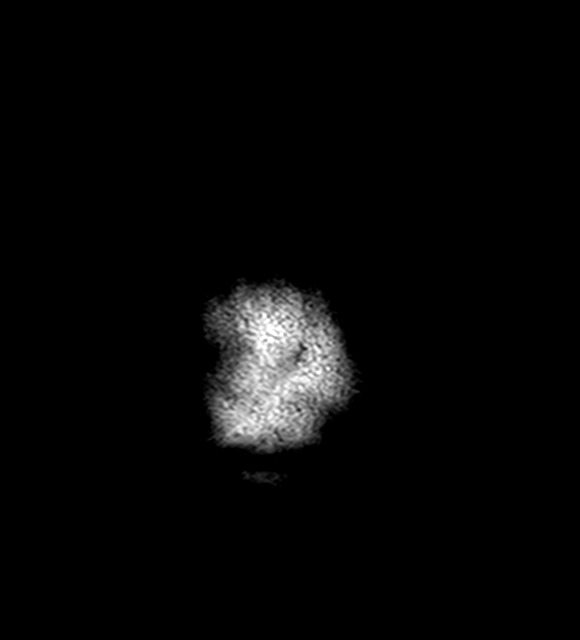
[im 34/34]
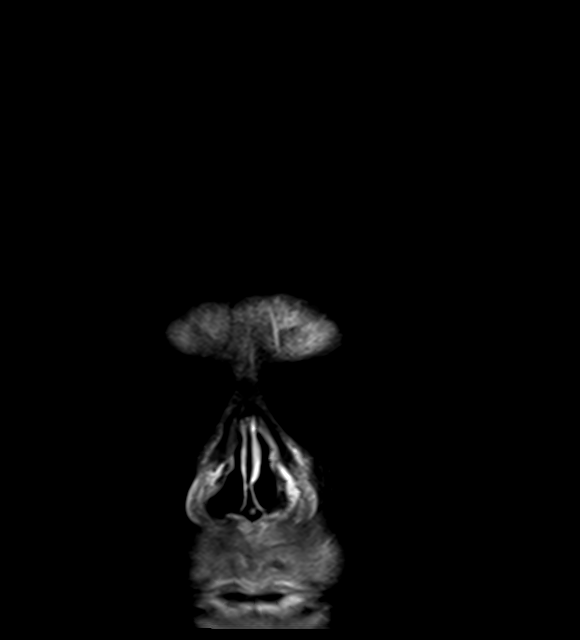

[44 of 48 positions shown; findings below may reference images not displayed]

FINDINGS: Brain: No restricted diffusion to suggest acute infarction. No
midline shift, mass effect, evidence of mass lesion,
ventriculomegaly, extra-axial collection or acute intracranial
hemorrhage. Cervicomedullary junction and pituitary are within
normal limits.

Chronic asymmetric left cerebellar hemisphere atrophy and/or
encephalomalacia is stable (series 17, image 11). And there is
associated chronic microhemorrhage in the left cerebellar peduncle
and perhaps affecting some of the deep left cerebellar nuclei
(series 14, image 15).

But no other chronic cerebral blood products. And elsewhere gray and
white matter signal remains largely normal for age. There is mild
chronic scattered nonspecific, mostly subcortical white matter T2
and FLAIR hyperintensity. No cerebral cortex encephalomalacia
identified. Deep gray matter nuclei and brainstem remain within
normal limits.

Vascular: Major intracranial vascular flow voids are stable since
[L8]. See also MRA reported separately.

Skull and upper cervical spine: Negative for age visible cervical
spine. Normal bone marrow signal.

Sinuses/Orbits: Rightward gaze deviation similar to [L8]. Otherwise
negative orbits. Stable paranasal sinus mucosal thickening.

Other: Chronic right mastoid effusion. Negative visible nasopharynx.
Other visible internal auditory structures appear grossly normal.
Negative visible scalp and face.
IMPRESSION: 1. No acute intracranial abnormality.
2. Chronic hemorrhage and atrophy in the left cerebellum, stable
since [DATE]. Otherwise mild for age nonspecific white matter signal changes.

## 2021-02-22 IMAGING — MR MR MRA HEAD W/O CM
1 series · 19 of 48 positions shown · non-contrast
Comparison: Brain MRI and neck MRA today.

CLINICAL DATA: 64-year-old male code stroke presentation. Left arm
and right face deficits.

EXAM:
MRA HEAD WITHOUT CONTRAST
TECHNIQUE: Angiographic images of the Circle of Willis were acquired using MRA
technique without intravenous contrast.

[Series 5: 3d cow · axial · 0.5mm · 0.43mm/px · z∈[-75,+14]mm · 19 of 188 slices shown]
[im 1/188]
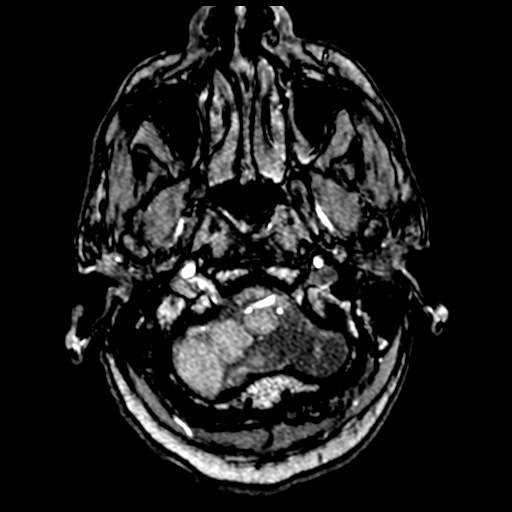
[im 4/188]
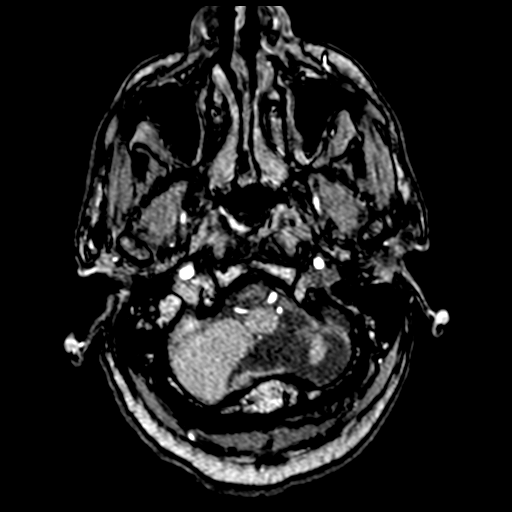
[im 8/188]
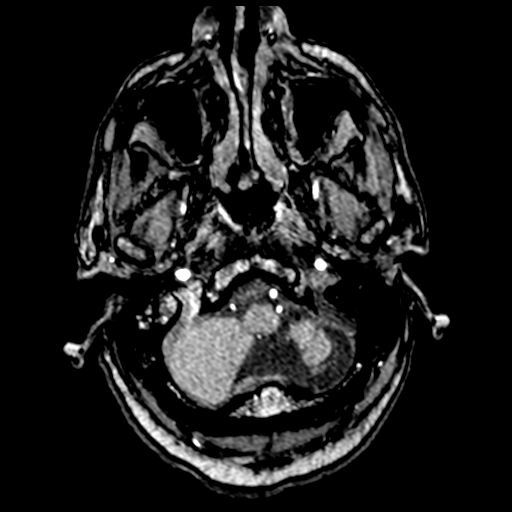
[im 12/188]
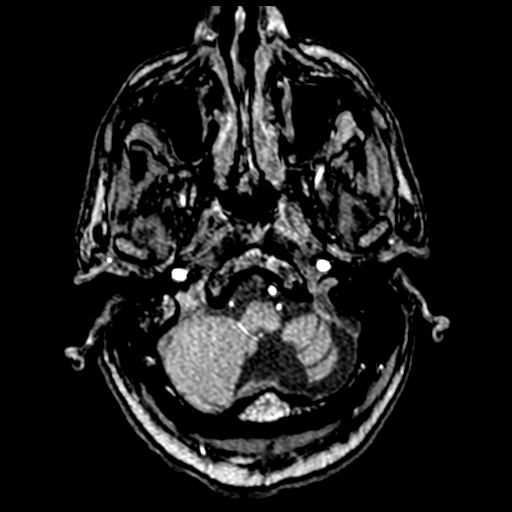
[im 16/188]
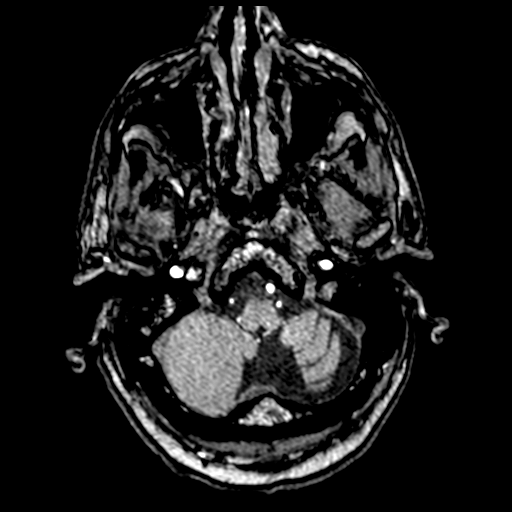
[im 20/188]
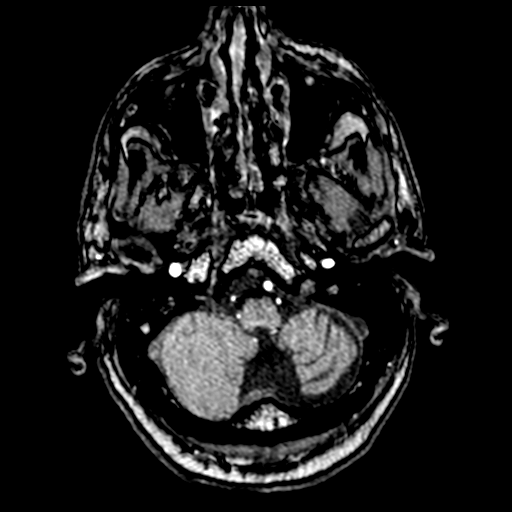
[im 24/188]
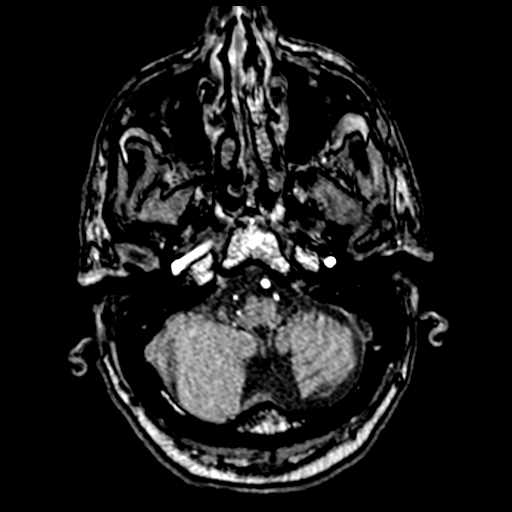
[im 28/188]
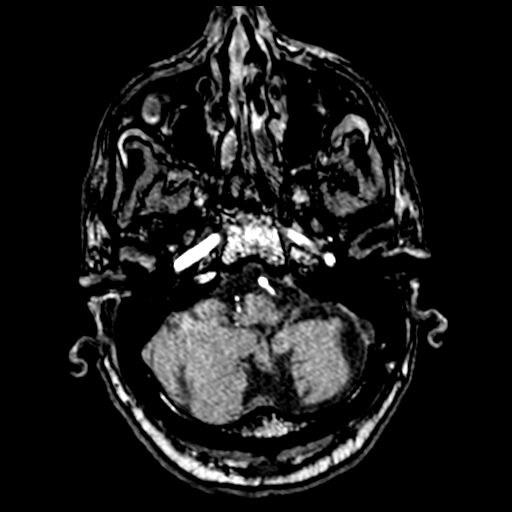
[im 32/188]
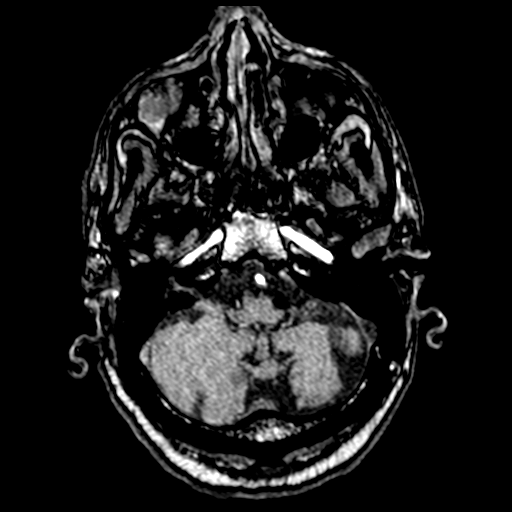
[im 36/188]
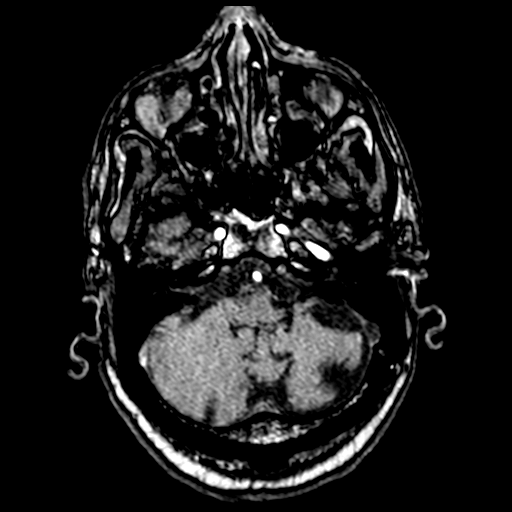
[im 40/188]
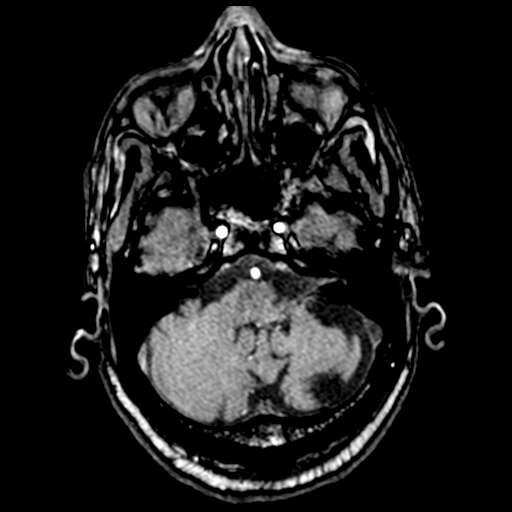
[im 60/188]
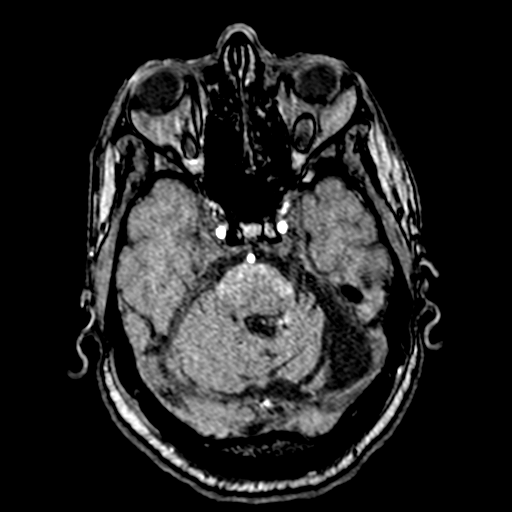
[im 84/188]
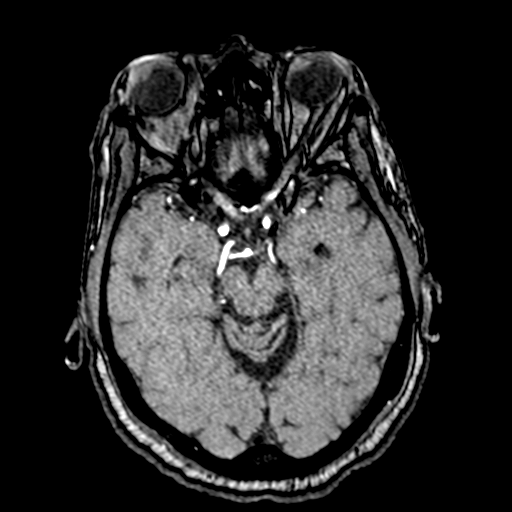
[im 96/188]
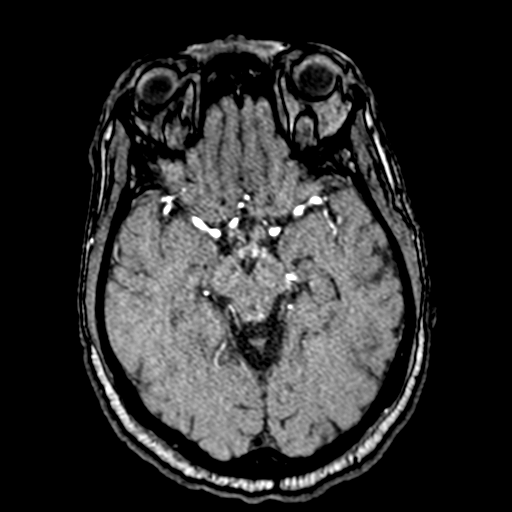
[im 108/188]
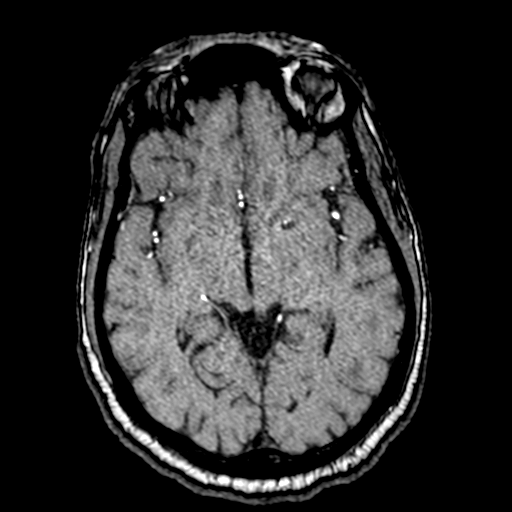
[im 132/188]
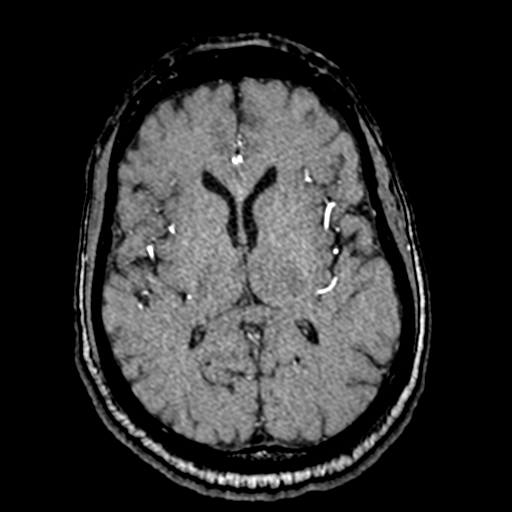
[im 156/188]
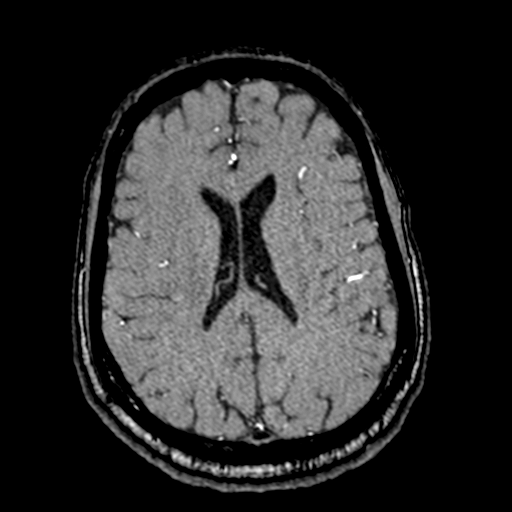
[im 160/188]
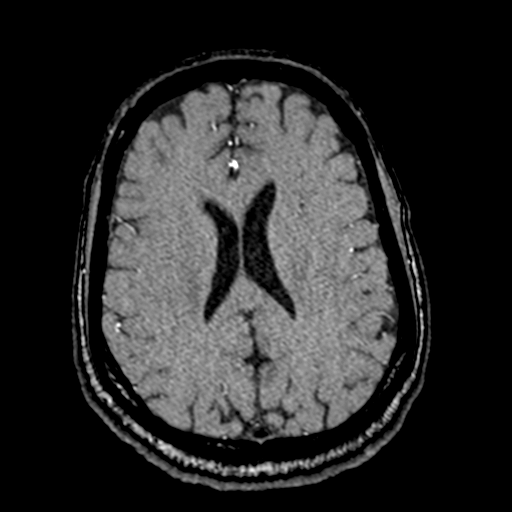
[im 180/188]
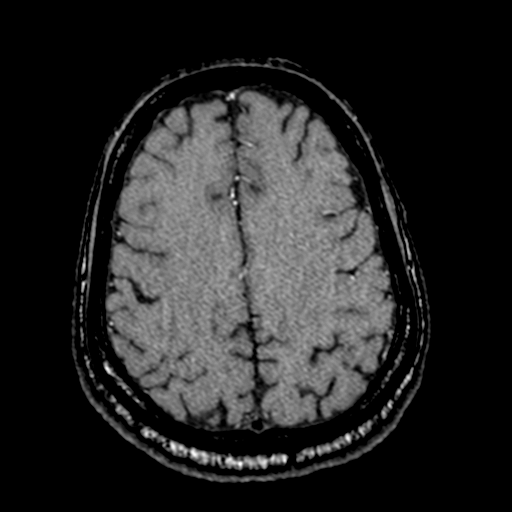

[19 of 48 positions shown; findings below may reference images not displayed]

FINDINGS: Antegrade flow in the posterior circulation with dominant distal
right vertebral artery (normal variant), better demonstrated on the
postcontrast neck MRA today. The non dominant left vertebral artery
remains patent to the vertebrobasilar junction. Normal right PICA
origin. No vertebrobasilar junction or basilar artery stenosis.
Patent SCA origins. Normal right PCA origin. Fetal type left PCA
origin. Right posterior communicating artery diminutive or absent.
Bilateral PCA branches are within normal limits.

Antegrade flow in both ICA siphons. No siphon stenosis. Normal
ophthalmic and left posterior communicating artery origins. Patent
carotid termini. Dominant right and diminutive left ACA A1 segments
(normal variant). Anterior communicating artery appears normal.
Visible bilateral ACA branches are within normal limits. MCA
origins, M1 segments, bifurcations and visible bilateral MCA
branches are within normal limits.
IMPRESSION: Negative intracranial MRA.

## 2021-02-22 MED ORDER — ENSURE ENLIVE PO LIQD
237.0000 mL | Freq: Two times a day (BID) | ORAL | Status: DC
  Administered 2021-02-22 (×2): 237 mL via ORAL
  Filled 2021-02-22: qty 237

## 2021-02-22 MED ORDER — GADOBUTROL 1 MMOL/ML IV SOLN
8.0000 mL | Freq: Once | INTRAVENOUS | Status: AC | PRN
  Administered 2021-02-22: 8 mL via INTRAVENOUS

## 2021-02-22 MED ORDER — ATORVASTATIN CALCIUM 40 MG PO TABS
40.0000 mg | ORAL_TABLET | Freq: Every day | ORAL | Status: DC
  Administered 2021-02-22: 40 mg via ORAL
  Filled 2021-02-22: qty 1

## 2021-02-22 MED ORDER — LORAZEPAM 2 MG/ML IJ SOLN
INTRAMUSCULAR | Status: AC
  Administered 2021-02-22: 0.5 mg via INTRAVENOUS
  Filled 2021-02-22: qty 1

## 2021-02-22 MED ORDER — ATORVASTATIN CALCIUM 10 MG PO TABS: 20.0000 mg | ORAL_TABLET | Freq: Every day | ORAL | Status: DC

## 2021-02-22 MED ORDER — APIXABAN 5 MG PO TABS
5.0000 mg | ORAL_TABLET | Freq: Two times a day (BID) | ORAL | Status: DC
  Administered 2021-02-22 – 2021-02-25 (×6): 5 mg via ORAL
  Filled 2021-02-22 (×6): qty 1

## 2021-02-22 MED ORDER — APIXABAN 5 MG PO TABS
5.0000 mg | ORAL_TABLET | Freq: Two times a day (BID) | ORAL | Status: DC
  Administered 2021-02-22: 5 mg via ORAL
  Filled 2021-02-22: qty 1

## 2021-02-22 MED ORDER — LORAZEPAM 2 MG/ML IJ SOLN: 0.5000 mg | Freq: Once | INTRAMUSCULAR | Status: AC | PRN

## 2021-02-22 MED ORDER — ENSURE ENLIVE PO LIQD
237.0000 mL | Freq: Three times a day (TID) | ORAL | Status: DC
  Administered 2021-02-22 – 2021-02-25 (×8): 237 mL via ORAL

## 2021-02-22 MED ORDER — POLYVINYL ALCOHOL 1.4 % OP SOLN
1.0000 [drp] | OPHTHALMIC | Status: DC | PRN
  Administered 2021-02-22: 1 [drp] via OPHTHALMIC
  Filled 2021-02-22: qty 15

## 2021-02-22 MED ORDER — ATORVASTATIN CALCIUM 10 MG PO TABS
20.0000 mg | ORAL_TABLET | Freq: Every day | ORAL | Status: DC
  Administered 2021-02-23 – 2021-02-25 (×3): 20 mg via ORAL
  Filled 2021-02-22 (×3): qty 2

## 2021-02-22 MED ORDER — ABACAVIR-DOLUTEGRAVIR-LAMIVUD 600-50-300 MG PO TABS
1.0000 | ORAL_TABLET | Freq: Every day | ORAL | Status: DC
  Administered 2021-02-22 – 2021-02-25 (×4): 1 via ORAL
  Filled 2021-02-22 (×4): qty 1

## 2021-02-22 MED ORDER — ASPIRIN EC 81 MG PO TBEC
81.0000 mg | DELAYED_RELEASE_TABLET | Freq: Every day | ORAL | Status: DC
  Administered 2021-02-22 – 2021-02-25 (×4): 81 mg via ORAL
  Filled 2021-02-22 (×4): qty 1

## 2021-02-22 MED ORDER — METOPROLOL TARTRATE 25 MG PO TABS
25.0000 mg | ORAL_TABLET | Freq: Two times a day (BID) | ORAL | Status: DC
  Administered 2021-02-22 – 2021-02-23 (×3): 25 mg via ORAL
  Filled 2021-02-22 (×4): qty 1

## 2021-02-22 MED ORDER — ACETAMINOPHEN 325 MG PO TABS
650.0000 mg | ORAL_TABLET | Freq: Four times a day (QID) | ORAL | Status: DC | PRN
  Administered 2021-02-23: 650 mg via ORAL
  Filled 2021-02-22: qty 2

## 2021-02-22 MED ORDER — ACETAMINOPHEN 650 MG RE SUPP: 650.0000 mg | Freq: Four times a day (QID) | RECTAL | Status: DC | PRN

## 2021-02-22 MED ORDER — ADULT MULTIVITAMIN W/MINERALS CH
1.0000 | ORAL_TABLET | Freq: Every day | ORAL | Status: DC
  Administered 2021-02-22 – 2021-02-25 (×4): 1 via ORAL
  Filled 2021-02-22 (×4): qty 1

## 2021-02-22 NOTE — H&P (Signed)
History and Physical    Antonious Omahoney NGE:[id number] DOB: 12-24-1950 DOA: 02/21/2021  PCP: [facility] Patient coming from: Home  Chief Complaint: Code stroke  HPI: Ranard Harte is a 70 y.o. male with medical history significant of stroke on aspirin and Eliquis presented to the ED as code stroke for evaluation of acute onset and transient mild right facial droop and loss of coordination on the left.  LKW 1800.  Not a tPA candidate due to being on Eliquis.  CT head negative for acute intracranial abnormality.  Labs showing WBC 4.7, hemoglobin 14.7, platelet count 193k.  Sodium 137, potassium 4.2, chloride 106, bicarb 24, BUN 23, creatinine 1.2, glucose 192.  COVID and influenza PCR negative.  Patient states yesterday at 6 PM he was on a conference call when suddenly felt dizzy and was not able to get up.  He was having difficulty speaking.  Reports history of prior stroke several years ago and takes aspirin and Eliquis.  However, he stopped taking Eliquis last Thursday as he had a dental procedure done this Monday.  Reports chronic left-sided weakness from prior stroke.  Reports history of HIV for which he takes a medication but does not remember the name.  He also uses some eyedrops.  Denies any other medical problems.  Review of Systems:  All systems reviewed and apart from history of presenting illness, are negative.  Past Medical History:  Diagnosis Date   Stroke [facility])     History reviewed. No pertinent surgical history.   reports that he has never smoked. He has never used smokeless tobacco. He reports that he does not drink alcohol and does not use drugs.  No Known Allergies  History reviewed. No pertinent family history.  Prior to Admission medications   Not on File    Physical Exam: Vitals:   02/22/21 0430 02/22/21 0500 02/22/21 0612 02/22/21 0700  BP: 121/83 126/73 126/82 (!) 136/101  Pulse: 69 (!) 57 82 76  Resp: 15 (!) 21 18 (!) 25  Temp:       TempSrc:      SpO2: 95% 91% 97% 93%  Weight:      Height:        Physical Exam Constitutional:      General: He is not in acute distress. HENT:     Head: Normocephalic and atraumatic.  Eyes:     Extraocular Movements: Extraocular movements intact.     Conjunctiva/sclera: Conjunctivae normal.  Cardiovascular:     Rate and Rhythm: Normal rate and regular rhythm.     Pulses: Normal pulses.  Pulmonary:     Effort: Pulmonary effort is normal. No respiratory distress.     Breath sounds: Normal breath sounds. No wheezing or rales.  Abdominal:     General: Bowel sounds are normal. There is no distension.     Palpations: Abdomen is soft.     Tenderness: There is no abdominal tenderness.  Musculoskeletal:        General: No swelling or tenderness.     Cervical back: Normal range of motion and neck supple.  Skin:    General: Skin is warm and dry.  Neurological:     Mental Status: He is alert and oriented to person, place, and time.     Comments: Mild right facial droop Speech fluent Left upper extremity: Ataxia noted on finger to nose test Strength 5 out of 5 in bilateral upper and lower extremities Sensation to light touch intact throughout  Labs on Admission: I have personally reviewed following labs and imaging studies  CBC: Recent Labs  Lab 02/21/21 1900 02/21/21 1907  WBC 4.7  --   NEUTROABS 3.1  --   HGB 14.7 16.0  HCT 45.5 47.0  MCV 105.1*  --   PLT 193  --    Basic Metabolic Panel: Recent Labs  Lab 02/21/21 1900 02/21/21 1907  NA 137 141  K 4.2 4.4  CL 106 107  CO2 24  --   GLUCOSE 192* 193*  BUN 23 27*  CREATININE 1.25* 1.00  CALCIUM 9.2  --    GFR: Estimated Creatinine Clearance: 84.8 mL/min (by C-G formula based on SCr of 1 mg/dL). Liver Function Tests: Recent Labs  Lab 02/21/21 1900  AST 37  ALT 23  ALKPHOS 71  BILITOT 1.0  PROT 7.7  ALBUMIN 3.9   No results for input(s): LIPASE, AMYLASE in the last 168 hours. No results for  input(s): AMMONIA in the last 168 hours. Coagulation Profile: Recent Labs  Lab 02/21/21 1900  INR 1.5*   Cardiac Enzymes: No results for input(s): CKTOTAL, CKMB, CKMBINDEX, TROPONINI in the last 168 hours. BNP (last 3 results) No results for input(s): PROBNP in the last 8760 hours. HbA1C: No results for input(s): HGBA1C in the last 72 hours. CBG: Recent Labs  Lab 02/21/21 1900 02/21/21 1927  GLUCAP 190* 178*   Lipid Profile: No results for input(s): CHOL, HDL, LDLCALC, TRIG, CHOLHDL, LDLDIRECT in the last 72 hours. Thyroid Function Tests: Recent Labs    02/22/21 0435  TSH 2.135   Anemia Panel: No results for input(s): VITAMINB12, FOLATE, FERRITIN, TIBC, IRON, RETICCTPCT in the last 72 hours. Urine analysis: No results found for: COLORURINE, APPEARANCEUR, LABSPEC, PHURINE, GLUCOSEU, HGBUR, BILIRUBINUR, KETONESUR, PROTEINUR, UROBILINOGEN, NITRITE, LEUKOCYTESUR  Radiological Exams on Admission: MR ANGIO HEAD WO CONTRAST  Result Date: 02/22/2021 CLINICAL DATA:  70 year old male code stroke presentation. Left arm and right face deficits. EXAM: MRA HEAD WITHOUT CONTRAST TECHNIQUE: Angiographic images of the Circle of Willis were acquired using MRA technique without intravenous contrast. COMPARISON:  Brain MRI and neck MRA today. FINDINGS: Antegrade flow in the posterior circulation with dominant distal right vertebral artery (normal variant), better demonstrated on the postcontrast neck MRA today. The non dominant left vertebral artery remains patent to the vertebrobasilar junction. Normal right PICA origin. No vertebrobasilar junction or basilar artery stenosis. Patent SCA origins. Normal right PCA origin. Fetal type left PCA origin. Right posterior communicating artery diminutive or absent. Bilateral PCA branches are within normal limits. Antegrade flow in both ICA siphons. No siphon stenosis. Normal ophthalmic and left posterior communicating artery origins. Patent carotid termini.  Dominant right and diminutive left ACA A1 segments (normal variant). Anterior communicating artery appears normal. Visible bilateral ACA branches are within normal limits. MCA origins, M1 segments, bifurcations and visible bilateral MCA branches are within normal limits. IMPRESSION: Negative intracranial MRA. Electronically Signed   By: Odessa Fleming M.D.   On: 02/22/2021 05:58   MR Angiogram Neck W or Wo Contrast  Result Date: 02/22/2021 CLINICAL DATA:  70 year old male code stroke presentation. Left arm and right face deficits. EXAM: MRA NECK WITHOUT AND WITH CONTRAST TECHNIQUE: Multiplanar and multiecho pulse sequences of the neck were obtained without and with intravenous contrast. Angiographic images of the neck were obtained using MRA technique without and with intravenous contrast. CONTRAST:  37mL GADAVIST GADOBUTROL 1 MMOL/ML IV SOLN COMPARISON:  Brain MRI and intracranial MRA today. FINDINGS: Precontrast time-of-flight images demonstrate antegrade flow  signal in the bilateral cervical carotid and vertebral arteries. The right vertebral appears dominant. Carotid bifurcations appear grossly normal. Following contrast 3 vessel arch configuration is noted. Right CCA is within normal limits. Right carotid bifurcation, right ICA origin and bulb appear normal. Negative cervical right ICA, and visible right ICA siphon, terminus. Left CCA is within normal limits. Negative left carotid bifurcation, cervical left ICA, and visible left ICA siphon. Prominent left posterior communicating artery (normal variant). Dominant right vertebral artery. Normal right vertebral artery origin. The left vertebral has an early origin (normal variant) which is not as well visualized. Both vertebrals are patent to the skull base. No vertebral artery stenosis identified. IMPRESSION: Negative MRA Neck. Electronically Signed   By: Odessa Fleming M.D.   On: 02/22/2021 05:55   MR Brain Wo Contrast (neuro protocol)  Result Date:  02/22/2021 CLINICAL DATA:  70 year old male code stroke presentation. Left arm and right face deficits. EXAM: MRI HEAD WITHOUT CONTRAST TECHNIQUE: Multiplanar, multiecho pulse sequences of the brain and surrounding structures were obtained without intravenous contrast. COMPARISON:  Plain head CT 02/21/2021. [facility] Brain MRI 10/05/2017. FINDINGS: Brain: No restricted diffusion to suggest acute infarction. No midline shift, mass effect, evidence of mass lesion, ventriculomegaly, extra-axial collection or acute intracranial hemorrhage. Cervicomedullary junction and pituitary are within normal limits. Chronic asymmetric left cerebellar hemisphere atrophy and/or encephalomalacia is stable (series 17, image 11). And there is associated chronic microhemorrhage in the left cerebellar peduncle and perhaps affecting some of the deep left cerebellar nuclei (series 14, image 15). But no other chronic cerebral blood products. And elsewhere gray and white matter signal remains largely normal for age. There is mild chronic scattered nonspecific, mostly subcortical white matter T2 and FLAIR hyperintensity. No cerebral cortex encephalomalacia identified. Deep gray matter nuclei and brainstem remain within normal limits. Vascular: Major intracranial vascular flow voids are stable since 2019. See also MRA reported separately. Skull and upper cervical spine: Negative for age visible cervical spine. Normal bone marrow signal. Sinuses/Orbits: Rightward gaze deviation similar to 2019. Otherwise negative orbits. Stable paranasal sinus mucosal thickening. Other: Chronic right mastoid effusion. Negative visible nasopharynx. Other visible internal auditory structures appear grossly normal. Negative visible scalp and face. IMPRESSION: 1. No acute intracranial abnormality. 2. 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 3. Otherwise mild for age nonspecific white matter  signal changes. Electronically Signed   By: Odessa Fleming M.D.   On: 02/22/2021 05:52   CT HEAD CODE STROKE WO CONTRAST  Result Date: 02/21/2021 CLINICAL DATA:  Code stroke.  Left arm, right face EXAM: CT HEAD WITHOUT CONTRAST TECHNIQUE: Contiguous axial images were obtained from the base of the skull through the vertex without intravenous contrast. COMPARISON:  None. FINDINGS: Brain: No evidence of acute infarction, hemorrhage, cerebral edema, mass, mass effect, or midline shift. Ventricles and sulci are normal for age. No extra-axial fluid collection. Vascular: No hyperdense vessel or unexpected calcification. Skull: Normal. Negative for fracture or focal lesion. Sinuses/Orbits: Mucosal thickening in the ethmoid air cells. The orbits are unremarkable. Other: None. ASPECTS [facility] Stroke Program Early CT Score) - Ganglionic level infarction (caudate, lentiform nuclei, internal capsule, insula, M1-M3 cortex): 7 - Supraganglionic infarction (M4-M6 cortex): 3 Total score (0-10 with 10 being normal): 10 IMPRESSION: 1. No acute intracranial process. 2. ASPECTS is 10. Code stroke imaging results were communicated on 02/21/2021 at 7:18 pm to provider [facility] via secure text paging. Electronically Signed   By: Jill Side  Vasan M.D.   On: 02/21/2021 19:19    EKG: Independently reviewed.  Sinus rhythm, no prior tracing for comparison.  Assessment/Plan Principal Problem:   TIA (transient ischemic attack) Active Problems:   HIV (human immunodeficiency virus infection) (HCC)   TIA Patient here for evaluation of acute onset mild right facial droop and loss of coordination on the left.  Both CT and MRI negative for acute stroke.  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  MRA head and neck negative. -Neurology following, appreciate recommendations -Telemetry monitoring -Permissive hypertension up to 220/110 for the first 24 hours -TTE -Hemoglobin A1c, fasting lipid  panel -TSH normal -Start statin if LDL >70 -Continue aspirin 81 mg daily and Eliquis per neurology recommendations -Frequent neurochecks -PT, OT, speech therapy. -N.p.o. until cleared by bedside swallow evaluation or formal speech evaluation  HIV Patient is on antiretroviral therapy but does not remember the name of his home medication.   -Pharmacy med rec pending  DVT prophylaxis: Eliquis Code Status: Patient wishes to be full code. Family Communication: Diagnostic findings and treatment plan discussed with the patient.  No family available at this time. Disposition Plan: Status is: Observation  The patient remains OBS appropriate and will d/c before 2 midnights.  Level of care:  Level of care: Telemetry Medical  The medical decision making on this patient was of high complexity and the patient is at high risk for clinical deterioration, therefore this is a level 3 visit.  John Giovanni MD [facility]  If 7PM-7AM, please contact night-coverage [email or web address]  02/22/2021, 8:33 AM

## 2021-02-22 NOTE — ED Notes (Signed)
Breakfast tray ordered 

## 2021-02-22 NOTE — Discharge Instructions (Addendum)
Suggestions For Increasing Calories And Protein Several small meals a day are easier to eat and digest than three large ones. Space meals about 2 to 3 hours apart to maximize comfort. Stop eating 2 to 3 hours before bed and sleep with your head elevated if gastric reflux (GERD) and heartburn are problems. Do not eat your favorite foods if you are feeling bad. Save them for when you feel good! Eat breakfast-type foods at any meal. Eggs are usually easy to eat and are great any time of the day. (The same goes for pancakes and waffles.) Eat when you feel hungry. Most people have the greatest appetite in the morning because they have not eaten all night. If this is the best meal for you, then pile on those calories and other nutrients in the morning and at lunch. Then you can have a smaller dinner without losing total calories for the day. Eat leftovers or nutritious snacks in the afternoon and early evening to round out your day. Try homemade or commercially prepared nutrition bars and puddings, as well as calorie- and protein-rich liquid nutritional supplements. Benefits of Physical Activity Talk to your doctor about physical activity. Light or moderate physical activity can help maintain muscle and promote an appetite. Walking in the neighborhood or the local mall is a great way to get up, get out, and get moving. If you are unsteady on your feet, try walking around the dining room table. Save Room for [employer]! Drink most fluids between meals instead of with meals. (It is fine to have a sip to help swallow food at meal time.) Fluids (which usually have fewer calories and nutrients than solid food) can take up valuable space in your stomach.  Foods Recommended High-Protein Foods Milk products Add cheese to toast, crackers, sandwiches, baked potatoes, vegetables, soups, noodles, meat, and fruit. Use reduced-fat (2%) or whole milk in place of water when cooking cereal and cream soups. Include  cream sauces on vegetables and pasta. Add powdered milk to cream soups and mashed potatoes.  Eggs Have hard-cooked eggs readily available in the refrigerator. Chop and add to salads, casseroles, soups, and vegetables. Make a quick egg salad. All eggs should be well cooked to avoid the risk of harmful bacteria.  Meats, poultry, and fish Add leftover cooked meats to soups, casseroles, salads, and omelets. Make dip by mixing diced, chopped, or shredded meat with sour cream and spices.  Beans, legumes, nuts, and seeds Sprinkle nuts and seeds on cereals, fruit, and desserts such as ice cream, pudding, and custard. Also serve nuts and seeds on vegetables, salads, and pasta. Spread peanut butter on toast, bread, English muffins, and fruit, or blend it in a milk shake. Add beans and peas to salads, soups, casseroles, and vegetable dishes.  High-Calorie Foods Butter, margarine, and  oils Melt butter or margarine over potatoes, rice, pasta, and cooked vegetables. Add melted butter or margarine into soups and casseroles and spread on bread for sandwiches before spreading sandwich spread or peanut butter. Saut or stir-fry vegetables, meats, chicken and fish such as shrimp/scallops in olive or canola oil. A variety of oils add calories and can be used to [employer], chicken, or fish.  Milk products Add whipping cream to desserts, pancakes, waffles, fruit, and hot chocolate, and fold it into soups and casseroles. Add sour cream to baked potatoes and vegetables.  Salad dressing Use regular (not low-fat or diet) mayonnaise and salad dressing on sandwiches and in dips with vegetables and fruit.  Sweets Add jelly and honey to bread and crackers. Add jam to fruit and ice cream and as a topping over cake.   Copyright 2020  Academy of [employer] and Dietetics. All rights reserved.      Information on my medicine - ELIQUIS (apixaban)  Why was Eliquis prescribed for you? Eliquis was prescribed for you to reduce  the risk of a blood clot forming that can cause a stroke if you have a medical condition called atrial fibrillation (a type of irregular heartbeat).  What do You need to know about Eliquis ? Take your Eliquis TWICE DAILY - one tablet in the morning and one tablet in the evening with or without food. If you have difficulty swallowing the tablet whole please discuss with your pharmacist how to take the medication safely.  Take Eliquis exactly as prescribed by your doctor and DO NOT stop taking Eliquis without talking to the doctor who prescribed the medication.  Stopping may increase your risk of developing a stroke.  Refill your prescription before you run out.  After discharge, you should have regular check-up appointments with your healthcare provider that is prescribing your Eliquis.  In the future your dose may need to be changed if your kidney function or weight changes by a significant amount or as you get older.  What do you do if you miss a dose? If you miss a dose, take it as soon as you remember on the same day and resume taking twice daily.  Do not take more than one dose of ELIQUIS at the same time to make up a missed dose.  Important Safety Information A possible side effect of Eliquis is bleeding. You should call your healthcare provider right away if you experience any of the following: Bleeding from an injury or your nose that does not stop. Unusual colored urine (red or dark brown) or unusual colored stools (red or black). Unusual bruising for unknown reasons. A serious fall or if you hit your head (even if there is no bleeding).  Some medicines may interact with Eliquis and might increase your risk of bleeding or clotting while on Eliquis. To help avoid this, consult your healthcare provider or pharmacist prior to using any new prescription or non-prescription medications, including herbals, vitamins, non-steroidal anti-inflammatory drugs (NSAIDs) and supplements.  This  website has more information on Eliquis (apixaban): http://www.eliquis.com/eliquis/home

## 2021-02-22 NOTE — Care Management Obs Status (Signed)
MEDICARE OBSERVATION STATUS NOTIFICATION   Patient Details  Name: Zachary Tucker MRN: [id number] Date of Birth: [birth year]   Medicare Observation Status Notification Given:  Yes    Kermit Balo, RN 02/22/2021, 2:58 PM

## 2021-02-22 NOTE — TOC Initial Note (Signed)
Transition of Care [facility]) - Initial/Assessment Note    Patient Details  Name: Zachary Tucker MRN: [id number] Date of Birth: [birth year]  Transition of Care [facility]) CM/SW Contact:    Kermit Balo, RN Phone Number: 02/22/2021, 3:18 PM  Clinical Narrative:                 Patient is from home alone. States his ex wife and sons work so no one to check on him during the day. Pt denies issues with home medications. He uses [facility] transportation for appointments. Pt states he may have medicaid. CM encouraged him to check into this as that would give him other transportation resources and he may qualify for some assistance at home.  Awaiting PT/OT evals.  TOC following.  Expected Discharge Plan: Home w Home Health Services Barriers to Discharge: Continued Medical Work up   Patient Goals and CMS Choice        Expected Discharge Plan and Services Expected Discharge Plan: Home w Home Health Services   Discharge Planning Services: CM Consult   Living arrangements for the past 2 months: Single Family Home                                      Prior Living Arrangements/Services Living arrangements for the past 2 months: Single Family Home Lives with:: Self Patient language and need for interpreter reviewed:: Yes Do you feel safe going back to the place where you live?: Yes          Current home services: DME (cane and shower seat) Criminal Activity/Legal Involvement Pertinent to Current Situation/Hospitalization: No - Comment as needed  Activities of Daily Living Home Assistive Devices/Equipment: Cane (specify quad or straight) ADL Screening (condition at time of admission) Patient's cognitive ability adequate to safely complete daily activities?: Yes Is the patient deaf or have difficulty hearing?: No Does the patient have difficulty seeing, even when wearing glasses/contacts?: No Does the patient have difficulty concentrating, remembering, or making decisions?: No Patient  able to express need for assistance with ADLs?: Yes Does the patient have difficulty dressing or bathing?: No Independently performs ADLs?: Yes (appropriate for developmental age) Does the patient have difficulty walking or climbing stairs?: No Weakness of Legs: None Weakness of Arms/Hands: None  Permission Sought/Granted                  Emotional Assessment Appearance:: Appears stated age Attitude/Demeanor/Rapport: Engaged Affect (typically observed): Accepting Orientation: : Oriented to Self, Oriented to Place, Oriented to  Time, Oriented to Situation   Psych Involvement: No (comment)  Admission diagnosis:  Stroke-like symptoms [R29.90] Cerebrovascular accident (CVA), unspecified mechanism (HCC) [I63.9] Patient Active Problem List   Diagnosis Date Noted   HIV (human immunodeficiency virus infection) (HCC) 02/22/2021   TIA (transient ischemic attack) 02/22/2021   PCP:  [facility], [facility] Pharmacy:   [id number] - [place], [place] - [address] AT [facility] [address] [place] [id number] Phone: [phone] Fax: [phone]     Social Determinants of Health (SDOH) Interventions    Readmission Risk Interventions No flowsheet data found.

## 2021-02-22 NOTE — ED Notes (Signed)
Patient currently at MRI

## 2021-02-22 NOTE — ED Notes (Signed)
Returned from MRI 

## 2021-02-22 NOTE — Progress Notes (Signed)
Initial Nutrition Assessment  DOCUMENTATION CODES:   Non-severe (moderate) malnutrition in context of acute illness/injury  INTERVENTION:  -Ensure Enlive po TID, each supplement provides 350 kcal and 20 grams of protein -Magic cup TID with meals, each supplement provides 290 kcal and 9 grams of protein -MVI with minerals daily -Provided education on increasing kcals/protein intake  NUTRITION DIAGNOSIS:   Moderate Malnutrition related to acute illness (dental surgery) as evidenced by energy intake < 75% for > 7 days, mild fat depletion, mild muscle depletion.  GOAL:   Patient will meet greater than or equal to 90% of their needs  MONITOR:   PO intake, Supplement acceptance, Labs, Weight trends, I & O's  REASON FOR ASSESSMENT:   Malnutrition Screening Tool    ASSESSMENT:   Pt with PMH significant for stroke admitted with TIA.  Pt reports having dental surgery last Monday and has had poor po intake since, primarily eating tomato soup. Pt also reports that his protein intake has been declining for quite some time prior to the surgery (unable to provide exact time frame) in anticipation for not being able to have much protein after his dental procedure. Pt otherwise denies changes to appetite/intake. Eats 3 balanced meals per day at baseline and drinks 1 Ensure/Boost drink per day. Pt agreeable to drinking 2-3 supplements per day. Reviewed importance of adequate protein/calorie intake and discussed methods for improving intake with pt.   No weight history available for review.   No PO Intake documented.   No UOP documented x24 hours I/O: +235ml since admit  Medications and labs reviewed.   NUTRITION - FOCUSED PHYSICAL EXAM: Flowsheet Row Most Recent Value  Orbital Region Mild depletion  Upper Arm Region Mild depletion  Thoracic and Lumbar Region Mild depletion  Buccal Region Mild depletion  Temple Region Moderate depletion  Clavicle Bone Region Mild depletion  Clavicle  and Acromion Bone Region Mild depletion  Scapular Bone Region Mild depletion  Dorsal Hand Mild depletion  Patellar Region Moderate depletion  Anterior Thigh Region Severe depletion  Posterior Calf Region Severe depletion  Edema (RD Assessment) None  Hair Reviewed  Eyes Reviewed  Mouth Reviewed  Skin Reviewed  Nails Reviewed       Diet Order:   Diet Order             DIET DYS 3 Room service appropriate? Yes; Fluid consistency: Thin  Diet effective now                   EDUCATION NEEDS:   Education needs have been addressed  Skin:  Skin Assessment: Reviewed RN Assessment  Last BM:  10/20  Height:   Ht Readings from Last 1 Encounters:  02/22/21 6\' 4"  (1.93 m)    Weight:   Wt Readings from Last 10 Encounters:  02/22/21 82.6 kg   BMI:  Body mass index is 22.15 kg/m.  Estimated Nutritional Needs:   Kcal:  2100-2300  Protein:  105-115 grams  Fluid:  >2L    02/24/21, MS, RD, LDN (she/her/hers) RD pager number and weekend/on-call pager number located in Amion.

## 2021-02-22 NOTE — Progress Notes (Signed)
TruePatient ID: Zachary Tucker, male   DOB: 03-May-1951, 70 y.o.   MRN: [id number]  PROGRESS NOTE    Zachary Tucker  EUM:[id number] DOB: July 17, 1950 DOA: 02/21/2021 PCP: [facility]    Brief Narrative:  70 year old with history of HIV and stroke on aspirin and Eliquis who presents for possible stroke evaluation due to right facial droop a loss of coordination on the left.  Patient was not a tPA candidate due to being on Eliquis.  He noted feeling dizzy and unable to get up he had stopped taking his Eliquis last Thursday due to needing a dental procedure which was done on Monday.   Assessment & Plan:   Principal Problem:   TIA (transient ischemic attack) Active Problems:   HIV (human immunodeficiency virus infection) (HCC)   Malnutrition of moderate degree  TIA Patient here for evaluation of acute onset mild right facial droop and loss of coordination on the left.  Both CT and MRI negative for acute stroke.  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  MRA head and neck negative. -Neurology following, appreciate recommendations -Telemetry monitoring -Permissive hypertension up to 220/110 for the first 24 hours -TTE -Hemoglobin A1c, fasting lipid panel LDL 69 -TSH normal -Continue aspirin 81 mg daily and Eliquis per neurology recommendations -Continue statin -Frequent neurochecks -PT, OT, speech therapy.  Hypertension Continue Lopressor   HIV Resume home meds  Severe malnutrition Continue Ensure Plus  DVT prophylaxis: VQ:[id number] Code Status: Full code  Family Communication: Patient at bedside Disposition Plan: Home likely in the morning   Consultants:  Neurology  Procedures: Echo reveals grade 1 diastolic dysfunction  Antimicrobials: Anti-infectives (From admission, onward)    None        Subjective: Patient reports feeling much better.  Objective: Vitals:   02/22/21 1045 02/22/21 1213  02/22/21 1320 02/22/21 1524  BP: 122/81 125/80 (!) 122/94 116/90  Pulse:  75 73 75  Resp: 18 16 20 18   Temp:  98.1 F (36.7 C) 98.3 F (36.8 C) 98 F (36.7 C)  TempSrc:  Oral Oral   SpO2: 95% 96% 96% 96%  Weight:   82.6 kg   Height:   6\' 4"  (1.93 m)     Intake/Output Summary (Last 24 hours) at 02/22/2021 1651 Last data filed at 02/22/2021 1300 Gross per 24 hour  Intake 240 ml  Output --  Net 240 ml   Filed Weights   02/21/21 1924 02/22/21 1320  Weight: 86 kg 82.6 kg    Examination:  General exam: Appears calm and comfortable  Respiratory system: Clear to auscultation. Respiratory effort normal. Cardiovascular system: S1 & S2 heard, RRR.  Gastrointestinal system: Abdomen is nondistended, soft and nontender.  Central nervous system: Alert and oriented. No focal neurological deficits. Extremities: Symmetric  Skin: No rashes Psychiatry: Judgement and insight appear normal. Mood & affect appropriate.     Data Reviewed: I have personally reviewed following labs and imaging studies  CBC: Recent Labs  Lab 02/21/21 1900 02/21/21 1907  WBC 4.7  --   NEUTROABS 3.1  --   HGB 14.7 16.0  HCT 45.5 47.0  MCV 105.1*  --   PLT 193  --    Basic Metabolic Panel: Recent Labs  Lab 02/21/21 1900 02/21/21 1907  NA 137 141  K 4.2 4.4  CL 106 107  CO2 24  --   GLUCOSE 192* 193*  BUN 23 27*  CREATININE 1.25* 1.00  CALCIUM 9.2  --  GFR: Estimated Creatinine Clearance: 81.5 mL/min (by C-G formula based on SCr of 1 mg/dL). Liver Function Tests: Recent Labs  Lab 02/21/21 1900  AST 37  ALT 23  ALKPHOS 71  BILITOT 1.0  PROT 7.7  ALBUMIN 3.9    Coagulation Profile: Recent Labs  Lab 02/21/21 1900  INR 1.5*    CBG: Recent Labs  Lab 02/21/21 1900 02/21/21 1927  GLUCAP 190* 178*   Lipid Profile: Recent Labs    02/22/21 0853  CHOL 115  HDL 33*  LDLCALC 69  TRIG 64  CHOLHDL 3.5   Thyroid Function Tests: Recent Labs    02/22/21 0435  TSH 2.135     Sepsis Labs: Recent Results (from the past 240 hour(s))  Resp Panel by RT-PCR (Flu A&B, Covid) Nasopharyngeal Swab     Status: None   Collection Time: 02/21/21  7:39 PM   Specimen: Nasopharyngeal Swab; Nasopharyngeal(NP) swabs in vial transport medium  Result Value Ref Range Status   SARS Coronavirus 2 by RT PCR NEGATIVE NEGATIVE Final    Comment: (NOTE) SARS-CoV-2 target nucleic acids are NOT DETECTED.  The SARS-CoV-2 RNA is generally detectable in upper respiratory specimens during the acute phase of infection. The lowest concentration of SARS-CoV-2 viral copies this assay can detect is 138 copies/mL. A negative result does not preclude SARS-Cov-2 infection and should not be used as the sole basis for treatment or other patient management decisions. A negative result may occur with  improper specimen collection/handling, submission of specimen other than nasopharyngeal swab, presence of viral mutation(s) within the areas targeted by this assay, and inadequate number of viral copies(<138 copies/mL). A negative result must be combined with clinical observations, patient history, and epidemiological information. The expected result is Negative.  Fact Sheet for Patients:  BloggerCourse.com  Fact Sheet for Healthcare Providers:  SeriousBroker.it  This test is no t yet approved or cleared by the [place] FDA and  has been authorized for detection and/or diagnosis of SARS-CoV-2 by FDA under an Emergency Use Authorization (EUA). This EUA will remain  in effect (meaning this test can be used) for the duration of the COVID-19 declaration under Section 564(b)(1) of the Act, 21 U.S.C.section 360bbb-3(b)(1), unless the authorization is terminated  or revoked sooner.       Influenza A by PCR NEGATIVE NEGATIVE Final   Influenza B by PCR NEGATIVE NEGATIVE Final    Comment: (NOTE) The Xpert Xpress SARS-CoV-2/FLU/RSV plus assay is  intended as an aid in the diagnosis of influenza from Nasopharyngeal swab specimens and should not be used as a sole basis for treatment. Nasal washings and aspirates are unacceptable for Xpert Xpress SARS-CoV-2/FLU/RSV testing.  Fact Sheet for Patients: BloggerCourse.com  Fact Sheet for Healthcare Providers: SeriousBroker.it  This test is not yet approved or cleared by the [place] FDA and has been authorized for detection and/or diagnosis of SARS-CoV-2 by FDA under an Emergency Use Authorization (EUA). This EUA will remain in effect (meaning this test can be used) for the duration of the COVID-19 declaration under Section 564(b)(1) of the Act, 21 U.S.C. section 360bbb-3(b)(1), unless the authorization is terminated or revoked.  Performed at [facility], [address], [place], [place] [address]       Radiology Studies: MR ANGIO HEAD WO CONTRAST  Result Date: 02/22/2021 CLINICAL DATA:  70 year old male code stroke presentation. Left arm and right face deficits. EXAM: MRA HEAD WITHOUT CONTRAST TECHNIQUE: Angiographic images of the Circle of Willis were acquired using MRA technique  without intravenous contrast. COMPARISON:  Brain MRI and neck MRA today. FINDINGS: Antegrade flow in the posterior circulation with dominant distal right vertebral artery (normal variant), better demonstrated on the postcontrast neck MRA today. The non dominant left vertebral artery remains patent to the vertebrobasilar junction. Normal right PICA origin. No vertebrobasilar junction or basilar artery stenosis. Patent SCA origins. Normal right PCA origin. Fetal type left PCA origin. Right posterior communicating artery diminutive or absent. Bilateral PCA branches are within normal limits. Antegrade flow in both ICA siphons. No siphon stenosis. Normal ophthalmic and left posterior communicating artery origins. Patent carotid termini. Dominant right  and diminutive left ACA A1 segments (normal variant). Anterior communicating artery appears normal. Visible bilateral ACA branches are within normal limits. MCA origins, M1 segments, bifurcations and visible bilateral MCA branches are within normal limits. IMPRESSION: Negative intracranial MRA. Electronically Signed   By: Odessa Fleming M.D.   On: 02/22/2021 05:58   MR Angiogram Neck W or Wo Contrast  Result Date: 02/22/2021 CLINICAL DATA:  70 year old male code stroke presentation. Left arm and right face deficits. EXAM: MRA NECK WITHOUT AND WITH CONTRAST TECHNIQUE: Multiplanar and multiecho pulse sequences of the neck were obtained without and with intravenous contrast. Angiographic images of the neck were obtained using MRA technique without and with intravenous contrast. CONTRAST:  8mL GADAVIST GADOBUTROL 1 MMOL/ML IV SOLN COMPARISON:  Brain MRI and intracranial MRA today. FINDINGS: Precontrast time-of-flight images demonstrate antegrade flow signal in the bilateral cervical carotid and vertebral arteries. The right vertebral appears dominant. Carotid bifurcations appear grossly normal. Following contrast 3 vessel arch configuration is noted. Right CCA is within normal limits. Right carotid bifurcation, right ICA origin and bulb appear normal. Negative cervical right ICA, and visible right ICA siphon, terminus. Left CCA is within normal limits. Negative left carotid bifurcation, cervical left ICA, and visible left ICA siphon. Prominent left posterior communicating artery (normal variant). Dominant right vertebral artery. Normal right vertebral artery origin. The left vertebral has an early origin (normal variant) which is not as well visualized. Both vertebrals are patent to the skull base. No vertebral artery stenosis identified. IMPRESSION: Negative MRA Neck. Electronically Signed   By: Odessa Fleming M.D.   On: 02/22/2021 05:55   MR Brain Wo Contrast (neuro protocol)  Result Date: 02/22/2021 CLINICAL DATA:   70 year old male code stroke presentation. Left arm and right face deficits. EXAM: MRI HEAD WITHOUT CONTRAST TECHNIQUE: Multiplanar, multiecho pulse sequences of the brain and surrounding structures were obtained without intravenous contrast. COMPARISON:  Plain head CT 02/21/2021. [facility] Brain MRI 10/05/2017. FINDINGS: Brain: No restricted diffusion to suggest acute infarction. No midline shift, mass effect, evidence of mass lesion, ventriculomegaly, extra-axial collection or acute intracranial hemorrhage. Cervicomedullary junction and pituitary are within normal limits. Chronic asymmetric left cerebellar hemisphere atrophy and/or encephalomalacia is stable (series 17, image 11). And there is associated chronic microhemorrhage in the left cerebellar peduncle and perhaps affecting some of the deep left cerebellar nuclei (series 14, image 15). But no other chronic cerebral blood products. And elsewhere gray and white matter signal remains largely normal for age. There is mild chronic scattered nonspecific, mostly subcortical white matter T2 and FLAIR hyperintensity. No cerebral cortex encephalomalacia identified. Deep gray matter nuclei and brainstem remain within normal limits. Vascular: Major intracranial vascular flow voids are stable since 2019. See also MRA reported separately. Skull and upper cervical spine: Negative for age visible cervical spine. Normal bone marrow signal. Sinuses/Orbits: Rightward gaze deviation  similar to 2019. Otherwise negative orbits. Stable paranasal sinus mucosal thickening. Other: Chronic right mastoid effusion. Negative visible nasopharynx. Other visible internal auditory structures appear grossly normal. Negative visible scalp and face. IMPRESSION: 1. No acute intracranial abnormality. 2. 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 3. Otherwise mild for age nonspecific white matter signal changes.  Electronically Signed   By: Odessa Fleming M.D.   On: 02/22/2021 05:52   ECHOCARDIOGRAM COMPLETE  Result Date: 02/22/2021    ECHOCARDIOGRAM REPORT   Patient Name:   Zachary Tucker Date of Exam: 02/22/2021 Medical Rec #:  [id number]     Height:       77.0 in Accession #:    [id number]    Weight:       189.6 lb Date of Birth:  December 28, 1950     BSA:          2.187 m Patient Age:    70 years      BP:           125/64 mmHg Patient Gender: M             HR:           65 bpm. Exam Location:  Inpatient Procedure: 2D Echo, Cardiac Doppler and Color Doppler Indications:    TIA  History:        Patient has no prior history of Echocardiogram examinations.  Sonographer:    Melton Krebs RDCS, FE, PE Referring Phys: [id number] JINDONG XU IMPRESSIONS  1. Left ventricular ejection fraction, by estimation, is 60 to 65%. The left ventricle has normal function. The left ventricle has no regional wall motion abnormalities. Left ventricular diastolic parameters are consistent with Grade I diastolic dysfunction (impaired relaxation).  2. Right ventricular systolic function is normal. The right ventricular size is normal.  3. The mitral valve is normal in structure. No evidence of mitral valve regurgitation. No evidence of mitral stenosis.  4. The aortic valve is tricuspid. Aortic valve regurgitation is not visualized. Mild aortic valve sclerosis is present, with no evidence of aortic valve stenosis.  5. The inferior vena cava is normal in size with greater than 50% respiratory variability, suggesting right atrial pressure of 3 mmHg. FINDINGS  Left Ventricle: Left ventricular ejection fraction, by estimation, is 60 to 65%. The left ventricle has normal function. The left ventricle has no regional wall motion abnormalities. The left ventricular internal cavity size was normal in size. There is  no left ventricular hypertrophy. Left ventricular diastolic parameters are consistent with Grade I diastolic dysfunction (impaired relaxation). Right  Ventricle: The right ventricular size is normal. Right ventricular systolic function is normal. Left Atrium: Left atrial size was normal in size. Right Atrium: Right atrial size was normal in size. Pericardium: There is no evidence of pericardial effusion. Mitral Valve: The mitral valve is normal in structure. No evidence of mitral valve regurgitation. No evidence of mitral valve stenosis. Tricuspid Valve: The tricuspid valve is normal in structure. Tricuspid valve regurgitation is trivial. No evidence of tricuspid stenosis. Aortic Valve: The aortic valve is tricuspid. Aortic valve regurgitation is not visualized. Mild aortic valve sclerosis is present, with no evidence of aortic valve stenosis. Pulmonic Valve: The pulmonic valve was not well visualized. Pulmonic valve regurgitation is not visualized. No evidence of pulmonic stenosis. Aorta: The aortic root is normal in size and structure. Venous: The inferior vena cava is normal in size with greater than 50% respiratory variability, suggesting right atrial pressure of 3  mmHg. IAS/Shunts: The interatrial septum was not well visualized.  LEFT VENTRICLE PLAX 2D LVIDd:         4.20 cm   Diastology LVIDs:         2.50 cm   LV e' medial:    4.33 cm/s LV PW:         1.10 cm   LV E/e' medial:  11.6 LV IVS:        1.10 cm   LV e' lateral:   7.56 cm/s LVOT diam:     2.20 cm   LV E/e' lateral: 6.7 LV SV:         64 LV SV Index:   29 LVOT Area:     3.80 cm  RIGHT VENTRICLE RV S prime:     11.50 cm/s TAPSE (M-mode): 1.4 cm LEFT ATRIUM             Index        RIGHT ATRIUM           Index LA diam:        4.40 cm 2.01 cm/m   RA Area:     14.10 cm LA Vol (A2C):   48.5 ml 22.18 ml/m  RA Volume:   33.90 ml  15.50 ml/m LA Vol (A4C):   47.3 ml 21.63 ml/m LA Biplane Vol: 50.0 ml 22.87 ml/m  AORTIC VALVE LVOT Vmax:   78.90 cm/s LVOT Vmean:  50.200 cm/s LVOT VTI:    0.169 m  AORTA Ao Root diam: 3.40 cm MITRAL VALVE MV Area (PHT): 2.01 cm    SHUNTS MV Decel Time: 377 msec     Systemic VTI:  0.17 m MV E velocity: 50.40 cm/s  Systemic Diam: 2.20 cm MV A velocity: 51.40 cm/s MV E/A ratio:  0.98 Olga Millers MD Electronically signed by Olga Millers MD Signature Date/Time: 02/22/2021/2:07:58 PM    Final    US THYROID  Result Date: 02/22/2021 CLINICAL DATA:  Thyroid nodule EXAM: THYROID ULTRASOUND TECHNIQUE: Ultrasound examination of the thyroid gland and adjacent soft tissues was performed. COMPARISON:  None. FINDINGS: Parenchymal Echotexture: Moderately heterogeneous Isthmus: 0.5 cm Right lobe: 7.4 x 3.6 x 5.7 cm Left lobe: 8.7 x 3.5 x 4.2 cm _________________________________________________________ Estimated total number of nodules >/= 1 cm: 7 Number of spongiform nodules >/=  2 cm not described below (TR1): 0 Number of mixed cystic and solid nodules >/= 1.5 cm not described below (TR2): 0 _________________________________________________________ Nodule # 1: Location: Right; superior Maximum size: 4.9 cm; Other 2 dimensions: 4.2 x 3.3 cm Composition: solid/almost completely solid (2) Echogenicity: isoechoic (1) Shape: not taller-than-wide (0) Margins: smooth (0) Echogenic foci: none (0) ACR TI-RADS total points: 3. ACR TI-RADS risk category: TR3 (3 points). ACR TI-RADS recommendations: **Given size (>/= 2.5 cm) and appearance, fine needle aspiration of this mildly suspicious nodule should be considered based on TI-RADS criteria. _________________________________________________________ Nodule # 2: Location: Right; mid Maximum size: 2.3 cm; Other 2 dimensions: 1.8 x 2.3 cm Composition: solid/almost completely solid (2) Echogenicity: isoechoic (1) Shape: not taller-than-wide (0) Margins: smooth (0) Echogenic foci: none (0) ACR TI-RADS total points: 3. ACR TI-RADS risk category: TR3 (3 points). ACR TI-RADS recommendations: *Given size (>/= 1.5 - 2.4 cm) and appearance, a follow-up ultrasound in 1 year should be considered based on TI-RADS criteria.  _________________________________________________________ Nodule # 3: Location: Right; inferior Maximum size: 1.2 cm; Other 2 dimensions: 1.1 x 1.2 cm Composition: solid/almost completely solid (2) Echogenicity: isoechoic (1) Shape: not taller-than-wide (0)  Margins: smooth (0) Echogenic foci: none (0) ACR TI-RADS total points: 3. ACR TI-RADS risk category: TR3 (3 points). ACR TI-RADS recommendations: Given size (<1.4 cm) and appearance, this nodule does NOT meet TI-RADS criteria for biopsy or dedicated follow-up. _________________________________________________________ Nodule # 4: Location: Left; superior Maximum size: 2.0 cm; Other 2 dimensions: 1.4 x 1.4 cm Composition: solid/almost completely solid (2) Echogenicity: isoechoic (1) Shape: not taller-than-wide (0) Margins: smooth (0) Echogenic foci: none (0) ACR TI-RADS total points: 3. ACR TI-RADS risk category: TR3 (3 points). ACR TI-RADS recommendations: *Given size (>/= 1.5 - 2.4 cm) and appearance, a follow-up ultrasound in 1 year should be considered based on TI-RADS criteria. _________________________________________________________ Nodule # 5: Location: Left; mid Maximum size: 1.8 cm; Other 2 dimensions: 1.5 x 1.7 cm Composition: mixed cystic and solid (1) Echogenicity: isoechoic (1) Shape: not taller-than-wide (0) Margins: smooth (0) Echogenic foci: none (0) ACR TI-RADS total points: 2. ACR TI-RADS risk category: TR2 (2 points). ACR TI-RADS recommendations: This nodule does NOT meet TI-RADS criteria for biopsy or dedicated follow-up. _________________________________________________________ Nodule # 6: Location: Left; superior Maximum size: 1.8 cm; Other 2 dimensions: 1.3 x 1.6 cm Composition: solid/almost completely solid (2) Echogenicity: isoechoic (1) Shape: not taller-than-wide (0) Margins: smooth (0) Echogenic foci: none (0) ACR TI-RADS total points: 3. ACR TI-RADS risk category: TR3 (3 points). ACR TI-RADS recommendations: *Given size (>/= 1.5 - 2.4  cm) and appearance, a follow-up ultrasound in 1 year should be considered based on TI-RADS criteria. _________________________________________________________ Nodule # 7: Location: Left; inferior Maximum size: 2.2 cm; Other 2 dimensions: 1.6 x 1.8 cm Composition: solid/almost completely solid (2) Echogenicity: isoechoic (1) Shape: not taller-than-wide (0) Margins: smooth (0) Echogenic foci: none (0) ACR TI-RADS total points: 3. ACR TI-RADS risk category: TR3 (3 points). ACR TI-RADS recommendations: *Given size (>/= 1.5 - 2.4 cm) and appearance, a follow-up ultrasound in 1 year should be considered based on TI-RADS criteria. _________________________________________________________ IMPRESSION: 1. Nodule 1 (TI-RADS 3) located in the superior right thyroid lobe meets criteria for FNA. 2. Nodules 2, 4, 6, and 7 meet criteria for imaging follow-up. Annual ultrasound surveillance is recommended until 5 years of stability is documented. The above is in keeping with the ACR TI-RADS recommendations - J Am Coll Radiol 2017;14:587-595. Electronically Signed   By: Acquanetta Belling M.D.   On: 02/22/2021 16:48   CT HEAD CODE STROKE WO CONTRAST  Result Date: 02/21/2021 CLINICAL DATA:  Code stroke.  Left arm, right face EXAM: CT HEAD WITHOUT CONTRAST TECHNIQUE: Contiguous axial images were obtained from the base of the skull through the vertex without intravenous contrast. COMPARISON:  None. FINDINGS: Brain: No evidence of acute infarction, hemorrhage, cerebral edema, mass, mass effect, or midline shift. Ventricles and sulci are normal for age. No extra-axial fluid collection. Vascular: No hyperdense vessel or unexpected calcification. Skull: Normal. Negative for fracture or focal lesion. Sinuses/Orbits: Mucosal thickening in the ethmoid air cells. The orbits are unremarkable. Other: None. ASPECTS [facility] Stroke Program Early CT Score) - Ganglionic level infarction (caudate, lentiform nuclei, internal capsule, insula, M1-M3  cortex): 7 - Supraganglionic infarction (M4-M6 cortex): 3 Total score (0-10 with 10 being normal): 10 IMPRESSION: 1. No acute intracranial process. 2. ASPECTS is 10. Code stroke imaging results were communicated on 02/21/2021 at 7:18 pm to provider [facility] via secure text paging. Electronically Signed   By: Wiliam Ke M.D.   On: 02/21/2021 19:19     Scheduled Meds:  apixaban  5 mg Oral BID   aspirin EC  81  mg Oral Daily   atorvastatin  40 mg Oral Daily   feeding supplement  237 mL Oral TID BM   multivitamin with minerals  1 tablet Oral Daily   Continuous Infusions:   LOS: 0 days    Reva Bores, MD 02/22/2021 4:51 PM [phone] [facility] If 7PM-7AM, please contact night-coverage 02/22/2021, 4:51 PM

## 2021-02-22 NOTE — ED Notes (Signed)
Patient still at MRI

## 2021-02-22 NOTE — Progress Notes (Signed)
STROKE TEAM PROGRESS NOTE   INTERVAL HISTORY No family at bedside.  Patient lying in bed, stated that yesterday he was at the Zoom call, he got up very quickly and he felt dizzy.  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  He came to ED for evaluation.  He also stated that he has thyroid nodules in the past, and recently he felt his thyroid nodule getting bigger and he has some swallowing difficulty recently.  His TSH was normal, will check thyroid ultrasound.  Vitals:   02/22/21 0430 02/22/21 0500 02/22/21 0612 02/22/21 0700  BP: 121/83 126/73 126/82 (!) 136/101  Pulse: 69 (!) 57 82 76  Resp: 15 (!) 21 18 (!) 25  Temp:      TempSrc:      SpO2: 95% 91% 97% 93%  Weight:      Height:       CBC:  Recent Labs  Lab 02/21/21 1900 02/21/21 1907  WBC 4.7  --   NEUTROABS 3.1  --   HGB 14.7 16.0  HCT 45.5 47.0  MCV 105.1*  --   PLT 193  --    Basic Metabolic Panel:  Recent Labs  Lab 02/21/21 1900 02/21/21 1907  NA 137 141  K 4.2 4.4  CL 106 107  CO2 24  --   GLUCOSE 192* 193*  BUN 23 27*  CREATININE 1.25* 1.00  CALCIUM 9.2  --     Lipid Panel: No results for input(s): CHOL, TRIG, HDL, CHOLHDL, VLDL, LDLCALC in the last 168 hours.  HgbA1c: No results for input(s): HGBA1C in the last 168 hours. Urine Drug Screen: No results for input(s): LABOPIA, COCAINSCRNUR, LABBENZ, AMPHETMU, THCU, LABBARB in the last 168 hours.  Alcohol Level No results for input(s): ETH in the last 168 hours.  IMAGING past 24 hours MR ANGIO HEAD WO CONTRAST  Result Date: 02/22/2021 CLINICAL DATA:  70 year old male code stroke presentation. Left arm and right face deficits. EXAM: MRA HEAD WITHOUT CONTRAST TECHNIQUE: Angiographic images of the Circle of Willis were acquired using MRA technique without intravenous contrast. COMPARISON:  Brain MRI and neck MRA today. FINDINGS: Antegrade flow in the posterior  circulation with dominant distal right vertebral artery (normal variant), better demonstrated on the postcontrast neck MRA today. The non dominant left vertebral artery remains patent to the vertebrobasilar junction. Normal right PICA origin. No vertebrobasilar junction or basilar artery stenosis. Patent SCA origins. Normal right PCA origin. Fetal type left PCA origin. Right posterior communicating artery diminutive or absent. Bilateral PCA branches are within normal limits. Antegrade flow in both ICA siphons. No siphon stenosis. Normal ophthalmic and left posterior communicating artery origins. Patent carotid termini. Dominant right and diminutive left ACA A1 segments (normal variant). Anterior communicating artery appears normal. Visible bilateral ACA branches are within normal limits. MCA origins, M1 segments, bifurcations and visible bilateral MCA branches are within normal limits. IMPRESSION: Negative intracranial MRA. Electronically Signed   By: Odessa Fleming M.D.   On: 02/22/2021 05:58   MR Angiogram Neck W or Wo Contrast  Result Date: 02/22/2021 CLINICAL DATA:  70 year old male code stroke presentation. Left arm and right face deficits. EXAM: MRA NECK WITHOUT AND WITH CONTRAST TECHNIQUE: Multiplanar and multiecho pulse sequences of the neck were obtained without and with intravenous contrast. Angiographic images of the neck were obtained using MRA technique without and with intravenous contrast. CONTRAST:  45mL GADAVIST GADOBUTROL 1 MMOL/ML  IV SOLN COMPARISON:  Brain MRI and intracranial MRA today. FINDINGS: Precontrast time-of-flight images demonstrate antegrade flow signal in the bilateral cervical carotid and vertebral arteries. The right vertebral appears dominant. Carotid bifurcations appear grossly normal. Following contrast 3 vessel arch configuration is noted. Right CCA is within normal limits. Right carotid bifurcation, right ICA origin and bulb appear normal. Negative cervical right ICA, and visible  right ICA siphon, terminus. Left CCA is within normal limits. Negative left carotid bifurcation, cervical left ICA, and visible left ICA siphon. Prominent left posterior communicating artery (normal variant). Dominant right vertebral artery. Normal right vertebral artery origin. The left vertebral has an early origin (normal variant) which is not as well visualized. Both vertebrals are patent to the skull base. No vertebral artery stenosis identified. IMPRESSION: Negative MRA Neck. Electronically Signed   By: Odessa Fleming M.D.   On: 02/22/2021 05:55   MR Brain Wo Contrast (neuro protocol)  Result Date: 02/22/2021 CLINICAL DATA:  70 year old male code stroke presentation. Left arm and right face deficits. EXAM: MRI HEAD WITHOUT CONTRAST TECHNIQUE: Multiplanar, multiecho pulse sequences of the brain and surrounding structures were obtained without intravenous contrast. COMPARISON:  Plain head CT 02/21/2021. [facility] Brain MRI 10/05/2017. FINDINGS: Brain: No restricted diffusion to suggest acute infarction. No midline shift, mass effect, evidence of mass lesion, ventriculomegaly, extra-axial collection or acute intracranial hemorrhage. Cervicomedullary junction and pituitary are within normal limits. Chronic asymmetric left cerebellar hemisphere atrophy and/or encephalomalacia is stable (series 17, image 11). And there is associated chronic microhemorrhage in the left cerebellar peduncle and perhaps affecting some of the deep left cerebellar nuclei (series 14, image 15). But no other chronic cerebral blood products. And elsewhere gray and white matter signal remains largely normal for age. There is mild chronic scattered nonspecific, mostly subcortical white matter T2 and FLAIR hyperintensity. No cerebral cortex encephalomalacia identified. Deep gray matter nuclei and brainstem remain within normal limits. Vascular: Major intracranial vascular flow voids are stable since  2019. See also MRA reported separately. Skull and upper cervical spine: Negative for age visible cervical spine. Normal bone marrow signal. Sinuses/Orbits: Rightward gaze deviation similar to 2019. Otherwise negative orbits. Stable paranasal sinus mucosal thickening. Other: Chronic right mastoid effusion. Negative visible nasopharynx. Other visible internal auditory structures appear grossly normal. Negative visible scalp and face. IMPRESSION: 1. No acute intracranial abnormality. 2. 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 3. Otherwise mild for age nonspecific white matter signal changes. Electronically Signed   By: Odessa Fleming M.D.   On: 02/22/2021 05:52   CT HEAD CODE STROKE WO CONTRAST  Result Date: 02/21/2021 CLINICAL DATA:  Code stroke.  Left arm, right face EXAM: CT HEAD WITHOUT CONTRAST TECHNIQUE: Contiguous axial images were obtained from the base of the skull through the vertex without intravenous contrast. COMPARISON:  None. FINDINGS: Brain: No evidence of acute infarction, hemorrhage, cerebral edema, mass, mass effect, or midline shift. Ventricles and sulci are normal for age. No extra-axial fluid collection. Vascular: No hyperdense vessel or unexpected calcification. Skull: Normal. Negative for fracture or focal lesion. Sinuses/Orbits: Mucosal thickening in the ethmoid air cells. The orbits are unremarkable. Other: None. ASPECTS [facility] Stroke Program Early CT Score) - Ganglionic level infarction (caudate, lentiform nuclei, internal capsule, insula, M1-M3 cortex): 7 - Supraganglionic infarction (M4-M6 cortex): 3 Total score (0-10 with 10 being normal): 10 IMPRESSION: 1. No acute intracranial process. 2. ASPECTS is 10. Code stroke imaging results were communicated on 02/21/2021  at 7:18 pm to provider [facility] via secure text paging. Electronically Signed   By: Wiliam Ke M.D.   On: 02/21/2021 19:19    PHYSICAL EXAM  Temp:  [98 F (36.7 C)-98.3 F (36.8 C)] 98 F  (36.7 C) (10/21 1524) Pulse Rate:  [42-82] 75 (10/21 1524) Resp:  [11-25] 18 (10/21 1524) BP: (116-153)/(61-101) 116/90 (10/21 1524) SpO2:  [91 %-100 %] 96 % (10/21 1524) Weight:  [82.6 kg-86 kg] 82.6 kg (10/21 1320)  General - Well nourished, well developed, in no apparent distress.  Right neck fullness  Ophthalmologic - fundi not visualized due to noncooperation.  Cardiovascular - Regular rhythm and rate, not in A. fib.  Mental Status -  Level of arousal and orientation to time, place, and person were intact. Language including expression, naming, repetition, comprehension was assessed and found intact. Fund of Knowledge was assessed and was intact.  Cranial Nerves II - XII - II - Visual field intact OU. III, IV, VI - Extraocular movements intact. V - Facial sensation intact bilaterally. VII - Facial movement intact bilaterally. VIII - Hearing & vestibular intact bilaterally. X - Palate elevates symmetrically. XI - Chin turning & shoulder shrug intact bilaterally. XII - Tongue protrusion intact.  Motor Strength - The patient's strength was normal in all extremities and pronator drift was absent.  Bulk was normal and fasciculations were absent.   Motor Tone - Muscle tone was assessed at the neck and appendages and was normal.  Reflexes - The patient's reflexes were symmetrical in all extremities and he had no pathological reflexes.  Sensory - Light touch, temperature/pinprick were assessed and were symmetrical.    Coordination - The patient had normal movements in the right hands and bilateral feet with no ataxia or dysmetria.  However left finger-to-nose mild dysmetria.  Tremor was absent.  Gait and Station - deferred.   ASSESSMENT/PLAN Mr. Zachary Tucker is a 70 y.o. left handed male with history of HIV, prior stroke on aspirin 81 and atrial fibrillation on eliquis daily, which had been discontinued for a dental procedure and restarted on day of admission, presenting with  right sided facial droop and difficulty with word finding.   Orthostatic hypotension versus presyncope Patient reported dizziness with quick standing up Orthostatic vital lying 141/86, sitting 121/86, standing 104/77 - put on TED hose CT head No acute abnormality.  Small vessel disease. Atrophy.  ASPECTS 10.     MRI No acute intracranial abnormality. 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 MRA head and neck negative  Echocardiogram: EF 60-65%  LDL 69 HgbA1c pending UDS positive for benzo VTE prophylaxis - scd  aspirin 81 mg daily and Eliquis (apixaban) daily prior to admission, now on aspirin 81 mg daily and Eliquis (apixaban) daily.  Continue on discharge Therapy recommendations:  pending Disposition:  pending  PAF History of A. Fib On home Eliquis, continue on discharge Rate controlled  History of stroke Patient stated that he had left cerebellar stroke 40 years ago MRI showed chronic left cerebellar atrophy and small hemorrhage Exam showed left upper extremity mild dysmetria, chronic per patient On aspirin PTA, continue aspirin on discharge  Hypertension Home meds:    Stable Long-term BP goal normotensive  Hyperlipidemia Home meds: Lipitor 20 LDL 69, goal < 70 Resume Lipitor 20 Continue statin at discharge   Thyroid nodule Patient reported thyroid nodule history and recent enlargement of right neck as well as difficulty swallowing Right neck fullness on exam Thyroid ultrasound showed  multiple nodules with 1 needing FNA  Other Stroke Risk Factors  Advanced Age >/= 44    Other Active Problems HIV  Hep C  Hospital day # 0  Neurology will sign off. Please call with questions.  Neuro follow-up as needed.  Thanks for the consult.   Marvel Plan, MD PhD Stroke Neurology 02/22/2021 7:29 PM   To contact Stroke Continuity provider, please refer to [email or web address]. After hours, contact General Neurology

## 2021-02-22 NOTE — Progress Notes (Signed)
SLP Cancellation Note  Patient Details Name: Zachary Tucker MRN: [id number] DOB: February 29, 1951   Cancelled treatment:       Reason Eval/Treat Not Completed: Pt passed [place] swallow screen. RN reports pt tolerating diet without difficulty. Please reconsult if needs arise.  Delfino Friesen B. Murvin Natal, [facility], CCC-SLP Speech Language Pathologist Office: [phone]  [name] 02/22/2021, 2:00 PM

## 2021-02-22 NOTE — Progress Notes (Signed)
Zachary Tucker  Code Status: FULL Zachary Tucker is a 70 y.o. male patient admitted from ED awake, alert - oriented X4 - no acute distress noted. VSS -  no c/o shortness of breath, no chest pain. Call light within reach, patient able to voice, and demonstrate understanding. Skin, clean-dry- intact without evidence of bruising, or skin tears.  No evidence of skin break down noted on exam.  ?  Will cont to eval and treat per MD orders.  Jon Gills, RN  02/22/2021 2:00 PM

## 2021-02-22 NOTE — ED Notes (Signed)
Lunch ordered 

## 2021-02-23 DIAGNOSIS — E041 Nontoxic single thyroid nodule: Secondary | ICD-10-CM

## 2021-02-23 DIAGNOSIS — G459 Transient cerebral ischemic attack, unspecified: Secondary | ICD-10-CM | POA: Diagnosis not present

## 2021-02-23 DIAGNOSIS — B20 Human immunodeficiency virus [HIV] disease: Secondary | ICD-10-CM | POA: Diagnosis not present

## 2021-02-23 LAB — HEMOGLOBIN A1C
Hgb A1c MFr Bld: 5.9 % — ABNORMAL HIGH (ref 4.8–5.6)
Mean Plasma Glucose: 123 mg/dL

## 2021-02-23 MED ORDER — ASPIRIN 81 MG PO TBEC: 81.0000 mg | DELAYED_RELEASE_TABLET | Freq: Every day | ORAL | 11 refills | Status: AC

## 2021-02-23 NOTE — Plan of Care (Signed)
Pt is alert oriented x 4. Ambulatory. NIH-0. Denies pain. Pt resting.    Problem: Education: Goal: Knowledge of General Education information will improve Description: Including pain rating scale, medication(s)/side effects and non-pharmacologic comfort measures Outcome: Progressing   Problem: Health Behavior/Discharge Planning: Goal: Ability to manage health-related needs will improve Outcome: Progressing   Problem: Clinical Measurements: Goal: Ability to maintain clinical measurements within normal limits will improve Outcome: Progressing Goal: Will remain free from infection Outcome: Progressing Goal: Diagnostic test results will improve Outcome: Progressing Goal: Respiratory complications will improve Outcome: Progressing Goal: Cardiovascular complication will be avoided Outcome: Progressing   Problem: Activity: Goal: Risk for activity intolerance will decrease Outcome: Progressing   Problem: Nutrition: Goal: Adequate nutrition will be maintained Outcome: Progressing   Problem: Coping: Goal: Level of anxiety will decrease Outcome: Progressing   Problem: Elimination: Goal: Will not experience complications related to bowel motility Outcome: Progressing Goal: Will not experience complications related to urinary retention Outcome: Progressing   Problem: Pain Managment: Goal: General experience of comfort will improve Outcome: Progressing   Problem: Safety: Goal: Ability to remain free from injury will improve Outcome: Progressing   Problem: Skin Integrity: Goal: Risk for impaired skin integrity will decrease Outcome: Progressing   Problem: Education: Goal: Knowledge of disease or condition will improve Outcome: Progressing Goal: Knowledge of secondary prevention will improve (SELECT ALL) Outcome: Progressing   Problem: Health Behavior/Discharge Planning: Goal: Ability to manage health-related needs will improve Outcome: Progressing   Problem: Ischemic  Stroke/TIA Tissue Perfusion: Goal: Complications of ischemic stroke/TIA will be minimized Outcome: Progressing

## 2021-02-23 NOTE — Progress Notes (Signed)
Patient was working with PT and has a change in neuro status. NIH did show a change in score. MD and Neurology made aware. Advised to stay another night for evaluation. Plan to discharge home tomorrow. VS stable and patient has call light and telephone within reach., RN will continue to monitor.

## 2021-02-23 NOTE — Discharge Summary (Signed)
Physician Discharge Summary  Zachary Tucker IOE:[id number] DOB: 10-24-1950 DOA: 02/21/2021  PCP: [facility]  Admit date: 02/21/2021 Discharge date: 02/23/2021  Admitted From: Home Disposition: Home health  Recommendations for Outpatient Follow-up:  Follow up with PCP within 1-2 weeks for follow up of thyroid nodule Follow up with neurology in 1-2 weeks  Physical therapy Equipment/Devices:rolling walker  Discharge Condition:stable CODE STATUS:  Code Status: Full Code  Cardiac diet  Brief/Interim Summary: Patient presented to ED as code stroke for acute onset of right facial droop and loss of coordination on the left-hand side.  Was outside of the window for tPA treatment and on Eliquis chronically.  He had brief holding of his Eliquis last week due to a dental procedure.  Head CT and brain MRI/MRA were negative for acute intracranial abnormality.  His symptoms spontaneously resolved throughout admission.  Neurology was consulted for evaluation.  Pt reports being back to his baseline at time of discharge.  Discharge Diagnoses:  Principal Problem:   TIA (transient ischemic attack) Active Problems:   HIV (human immunodeficiency virus infection) (HCC)   Malnutrition of moderate degree  Allergies as of 02/23/2021       Reactions   Celebrex [celecoxib] Other (See Comments)   Constant runny nose   Shellfish Allergy         Medication List     STOP taking these medications    VITAMIN E PO       TAKE these medications    aspirin 81 MG EC tablet Take 1 tablet (81 mg total) by mouth daily. Swallow whole.   atorvastatin 20 MG tablet Commonly known as: LIPITOR Take 20 mg by mouth daily.   Eliquis 5 MG Tabs tablet Generic drug: apixaban Take 5 mg by mouth 2 (two) times daily.   Ensure Plus Liqd Take 237 mLs by mouth daily. Strawberry/vanilla   metoprolol tartrate 25 MG tablet Commonly known as: LOPRESSOR Take 25 mg by mouth 2 (two) times  daily.   polyvinyl alcohol 1.4 % ophthalmic solution Commonly known as: LIQUIFILM TEARS Place 1 drop into both eyes as needed for dry eyes.   PRESERVISION AREDS 2 PO Take 1 capsule by mouth daily.   Triumeq 600-50-300 MG tablet Generic drug: abacavir-dolutegravir-lamiVUDine Take 1 tablet by mouth daily.        Allergies  Allergen Reactions   Celebrex [Celecoxib] Other (See Comments)    Constant runny nose   Shellfish Allergy    Consultations: Neurology  Procedures/Studies: MR ANGIO HEAD WO CONTRAST  Result Date: 02/22/2021 CLINICAL DATA:  70 year old male code stroke presentation. Left arm and right face deficits. EXAM: MRA HEAD WITHOUT CONTRAST TECHNIQUE: Angiographic images of the Circle of Willis were acquired using MRA technique without intravenous contrast. COMPARISON:  Brain MRI and neck MRA today. FINDINGS: Antegrade flow in the posterior circulation with dominant distal right vertebral artery (normal variant), better demonstrated on the postcontrast neck MRA today. The non dominant left vertebral artery remains patent to the vertebrobasilar junction. Normal right PICA origin. No vertebrobasilar junction or basilar artery stenosis. Patent SCA origins. Normal right PCA origin. Fetal type left PCA origin. Right posterior communicating artery diminutive or absent. Bilateral PCA branches are within normal limits. Antegrade flow in both ICA siphons. No siphon stenosis. Normal ophthalmic and left posterior communicating artery origins. Patent carotid termini. Dominant right and diminutive left ACA A1 segments (normal variant). Anterior communicating artery appears normal. Visible bilateral ACA branches are within normal limits. MCA origins, M1 segments, bifurcations  and visible bilateral MCA branches are within normal limits. IMPRESSION: Negative intracranial MRA. Electronically Signed   By: Odessa Fleming M.D.   On: 02/22/2021 05:58   MR Angiogram Neck W or Wo Contrast  Result Date:  02/22/2021 CLINICAL DATA:  70 year old male code stroke presentation. Left arm and right face deficits. EXAM: MRA NECK WITHOUT AND WITH CONTRAST TECHNIQUE: Multiplanar and multiecho pulse sequences of the neck were obtained without and with intravenous contrast. Angiographic images of the neck were obtained using MRA technique without and with intravenous contrast. CONTRAST:  7mL GADAVIST GADOBUTROL 1 MMOL/ML IV SOLN COMPARISON:  Brain MRI and intracranial MRA today. FINDINGS: Precontrast time-of-flight images demonstrate antegrade flow signal in the bilateral cervical carotid and vertebral arteries. The right vertebral appears dominant. Carotid bifurcations appear grossly normal. Following contrast 3 vessel arch configuration is noted. Right CCA is within normal limits. Right carotid bifurcation, right ICA origin and bulb appear normal. Negative cervical right ICA, and visible right ICA siphon, terminus. Left CCA is within normal limits. Negative left carotid bifurcation, cervical left ICA, and visible left ICA siphon. Prominent left posterior communicating artery (normal variant). Dominant right vertebral artery. Normal right vertebral artery origin. The left vertebral has an early origin (normal variant) which is not as well visualized. Both vertebrals are patent to the skull base. No vertebral artery stenosis identified. IMPRESSION: Negative MRA Neck. Electronically Signed   By: Odessa Fleming M.D.   On: 02/22/2021 05:55   MR Brain Wo Contrast (neuro protocol)  Result Date: 02/22/2021 CLINICAL DATA:  70 year old male code stroke presentation. Left arm and right face deficits. EXAM: MRI HEAD WITHOUT CONTRAST TECHNIQUE: Multiplanar, multiecho pulse sequences of the brain and surrounding structures were obtained without intravenous contrast. COMPARISON:  Plain head CT 02/21/2021. [facility] Brain MRI 10/05/2017. FINDINGS: Brain: No restricted diffusion to suggest acute  infarction. No midline shift, mass effect, evidence of mass lesion, ventriculomegaly, extra-axial collection or acute intracranial hemorrhage. Cervicomedullary junction and pituitary are within normal limits. Chronic asymmetric left cerebellar hemisphere atrophy and/or encephalomalacia is stable (series 17, image 11). And there is associated chronic microhemorrhage in the left cerebellar peduncle and perhaps affecting some of the deep left cerebellar nuclei (series 14, image 15). But no other chronic cerebral blood products. And elsewhere gray and white matter signal remains largely normal for age. There is mild chronic scattered nonspecific, mostly subcortical white matter T2 and FLAIR hyperintensity. No cerebral cortex encephalomalacia identified. Deep gray matter nuclei and brainstem remain within normal limits. Vascular: Major intracranial vascular flow voids are stable since 2019. See also MRA reported separately. Skull and upper cervical spine: Negative for age visible cervical spine. Normal bone marrow signal. Sinuses/Orbits: Rightward gaze deviation similar to 2019. Otherwise negative orbits. Stable paranasal sinus mucosal thickening. Other: Chronic right mastoid effusion. Negative visible nasopharynx. Other visible internal auditory structures appear grossly normal. Negative visible scalp and face. IMPRESSION: 1. No acute intracranial abnormality. 2. 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 3. Otherwise mild for age nonspecific white matter signal changes. Electronically Signed   By: Odessa Fleming M.D.   On: 02/22/2021 05:52   ECHOCARDIOGRAM COMPLETE  Result Date: 02/22/2021    ECHOCARDIOGRAM REPORT   Patient Name:   WILLOUGHBY DOELL Date of Exam: 02/22/2021 Medical Rec #:  [id number]     Height:       77.0 in Accession #:    [id number]    Weight:  189.6 lb Date of Birth:  01/17/1951     BSA:          2.187 m Patient Age:    70 years      BP:           125/64 mmHg Patient  Gender: M             HR:           65 bpm. Exam Location:  Inpatient Procedure: 2D Echo, Cardiac Doppler and Color Doppler Indications:    TIA  History:        Patient has no prior history of Echocardiogram examinations.  Sonographer:    Melton Krebs RDCS, FE, PE Referring Phys: [id number] JINDONG XU IMPRESSIONS  1. Left ventricular ejection fraction, by estimation, is 60 to 65%. The left ventricle has normal function. The left ventricle has no regional wall motion abnormalities. Left ventricular diastolic parameters are consistent with Grade I diastolic dysfunction (impaired relaxation).  2. Right ventricular systolic function is normal. The right ventricular size is normal.  3. The mitral valve is normal in structure. No evidence of mitral valve regurgitation. No evidence of mitral stenosis.  4. The aortic valve is tricuspid. Aortic valve regurgitation is not visualized. Mild aortic valve sclerosis is present, with no evidence of aortic valve stenosis.  5. The inferior vena cava is normal in size with greater than 50% respiratory variability, suggesting right atrial pressure of 3 mmHg. FINDINGS  Left Ventricle: Left ventricular ejection fraction, by estimation, is 60 to 65%. The left ventricle has normal function. The left ventricle has no regional wall motion abnormalities. The left ventricular internal cavity size was normal in size. There is  no left ventricular hypertrophy. Left ventricular diastolic parameters are consistent with Grade I diastolic dysfunction (impaired relaxation). Right Ventricle: The right ventricular size is normal. Right ventricular systolic function is normal. Left Atrium: Left atrial size was normal in size. Right Atrium: Right atrial size was normal in size. Pericardium: There is no evidence of pericardial effusion. Mitral Valve: The mitral valve is normal in structure. No evidence of mitral valve regurgitation. No evidence of mitral valve stenosis. Tricuspid Valve: The tricuspid valve  is normal in structure. Tricuspid valve regurgitation is trivial. No evidence of tricuspid stenosis. Aortic Valve: The aortic valve is tricuspid. Aortic valve regurgitation is not visualized. Mild aortic valve sclerosis is present, with no evidence of aortic valve stenosis. Pulmonic Valve: The pulmonic valve was not well visualized. Pulmonic valve regurgitation is not visualized. No evidence of pulmonic stenosis. Aorta: The aortic root is normal in size and structure. Venous: The inferior vena cava is normal in size with greater than 50% respiratory variability, suggesting right atrial pressure of 3 mmHg. IAS/Shunts: The interatrial septum was not well visualized.  LEFT VENTRICLE PLAX 2D LVIDd:         4.20 cm   Diastology LVIDs:         2.50 cm   LV e' medial:    4.33 cm/s LV PW:         1.10 cm   LV E/e' medial:  11.6 LV IVS:        1.10 cm   LV e' lateral:   7.56 cm/s LVOT diam:     2.20 cm   LV E/e' lateral: 6.7 LV SV:         64 LV SV Index:   29 LVOT Area:     3.80 cm  RIGHT VENTRICLE RV S prime:  11.50 cm/s TAPSE (M-mode): 1.4 cm LEFT ATRIUM             Index        RIGHT ATRIUM           Index LA diam:        4.40 cm 2.01 cm/m   RA Area:     14.10 cm LA Vol (A2C):   48.5 ml 22.18 ml/m  RA Volume:   33.90 ml  15.50 ml/m LA Vol (A4C):   47.3 ml 21.63 ml/m LA Biplane Vol: 50.0 ml 22.87 ml/m  AORTIC VALVE LVOT Vmax:   78.90 cm/s LVOT Vmean:  50.200 cm/s LVOT VTI:    0.169 m  AORTA Ao Root diam: 3.40 cm MITRAL VALVE MV Area (PHT): 2.01 cm    SHUNTS MV Decel Time: 377 msec    Systemic VTI:  0.17 m MV E velocity: 50.40 cm/s  Systemic Diam: 2.20 cm MV A velocity: 51.40 cm/s MV E/A ratio:  0.98 Olga Millers MD Electronically signed by Olga Millers MD Signature Date/Time: 02/22/2021/2:07:58 PM    Final    US THYROID  Result Date: 02/22/2021 CLINICAL DATA:  Thyroid nodule EXAM: THYROID ULTRASOUND TECHNIQUE: Ultrasound examination of the thyroid gland and adjacent soft tissues was performed.  COMPARISON:  None. FINDINGS: Parenchymal Echotexture: Moderately heterogeneous Isthmus: 0.5 cm Right lobe: 7.4 x 3.6 x 5.7 cm Left lobe: 8.7 x 3.5 x 4.2 cm _________________________________________________________ Estimated total number of nodules >/= 1 cm: 7 Number of spongiform nodules >/=  2 cm not described below (TR1): 0 Number of mixed cystic and solid nodules >/= 1.5 cm not described below (TR2): 0 _________________________________________________________ Nodule # 1: Location: Right; superior Maximum size: 4.9 cm; Other 2 dimensions: 4.2 x 3.3 cm Composition: solid/almost completely solid (2) Echogenicity: isoechoic (1) Shape: not taller-than-wide (0) Margins: smooth (0) Echogenic foci: none (0) ACR TI-RADS total points: 3. ACR TI-RADS risk category: TR3 (3 points). ACR TI-RADS recommendations: **Given size (>/= 2.5 cm) and appearance, fine needle aspiration of this mildly suspicious nodule should be considered based on TI-RADS criteria. _________________________________________________________ Nodule # 2: Location: Right; mid Maximum size: 2.3 cm; Other 2 dimensions: 1.8 x 2.3 cm Composition: solid/almost completely solid (2) Echogenicity: isoechoic (1) Shape: not taller-than-wide (0) Margins: smooth (0) Echogenic foci: none (0) ACR TI-RADS total points: 3. ACR TI-RADS risk category: TR3 (3 points). ACR TI-RADS recommendations: *Given size (>/= 1.5 - 2.4 cm) and appearance, a follow-up ultrasound in 1 year should be considered based on TI-RADS criteria. _________________________________________________________ Nodule # 3: Location: Right; inferior Maximum size: 1.2 cm; Other 2 dimensions: 1.1 x 1.2 cm Composition: solid/almost completely solid (2) Echogenicity: isoechoic (1) Shape: not taller-than-wide (0) Margins: smooth (0) Echogenic foci: none (0) ACR TI-RADS total points: 3. ACR TI-RADS risk category: TR3 (3 points). ACR TI-RADS recommendations: Given size (<1.4 cm) and appearance, this nodule does NOT  meet TI-RADS criteria for biopsy or dedicated follow-up. _________________________________________________________ Nodule # 4: Location: Left; superior Maximum size: 2.0 cm; Other 2 dimensions: 1.4 x 1.4 cm Composition: solid/almost completely solid (2) Echogenicity: isoechoic (1) Shape: not taller-than-wide (0) Margins: smooth (0) Echogenic foci: none (0) ACR TI-RADS total points: 3. ACR TI-RADS risk category: TR3 (3 points). ACR TI-RADS recommendations: *Given size (>/= 1.5 - 2.4 cm) and appearance, a follow-up ultrasound in 1 year should be considered based on TI-RADS criteria. _________________________________________________________ Nodule # 5: Location: Left; mid Maximum size: 1.8 cm; Other 2 dimensions: 1.5 x 1.7 cm Composition: mixed cystic and solid (  1) Echogenicity: isoechoic (1) Shape: not taller-than-wide (0) Margins: smooth (0) Echogenic foci: none (0) ACR TI-RADS total points: 2. ACR TI-RADS risk category: TR2 (2 points). ACR TI-RADS recommendations: This nodule does NOT meet TI-RADS criteria for biopsy or dedicated follow-up. _________________________________________________________ Nodule # 6: Location: Left; superior Maximum size: 1.8 cm; Other 2 dimensions: 1.3 x 1.6 cm Composition: solid/almost completely solid (2) Echogenicity: isoechoic (1) Shape: not taller-than-wide (0) Margins: smooth (0) Echogenic foci: none (0) ACR TI-RADS total points: 3. ACR TI-RADS risk category: TR3 (3 points). ACR TI-RADS recommendations: *Given size (>/= 1.5 - 2.4 cm) and appearance, a follow-up ultrasound in 1 year should be considered based on TI-RADS criteria. _________________________________________________________ Nodule # 7: Location: Left; inferior Maximum size: 2.2 cm; Other 2 dimensions: 1.6 x 1.8 cm Composition: solid/almost completely solid (2) Echogenicity: isoechoic (1) Shape: not taller-than-wide (0) Margins: smooth (0) Echogenic foci: none (0) ACR TI-RADS total points: 3. ACR TI-RADS risk category: TR3  (3 points). ACR TI-RADS recommendations: *Given size (>/= 1.5 - 2.4 cm) and appearance, a follow-up ultrasound in 1 year should be considered based on TI-RADS criteria. _________________________________________________________ IMPRESSION: 1. Nodule 1 (TI-RADS 3) located in the superior right thyroid lobe meets criteria for FNA. 2. Nodules 2, 4, 6, and 7 meet criteria for imaging follow-up. Annual ultrasound surveillance is recommended until 5 years of stability is documented. The above is in keeping with the ACR TI-RADS recommendations - J Am Coll Radiol 2017;14:587-595. Electronically Signed   By: Acquanetta Belling M.D.   On: 02/22/2021 16:48   CT HEAD CODE STROKE WO CONTRAST  Result Date: 02/21/2021 CLINICAL DATA:  Code stroke.  Left arm, right face EXAM: CT HEAD WITHOUT CONTRAST TECHNIQUE: Contiguous axial images were obtained from the base of the skull through the vertex without intravenous contrast. COMPARISON:  None. FINDINGS: Brain: No evidence of acute infarction, hemorrhage, cerebral edema, mass, mass effect, or midline shift. Ventricles and sulci are normal for age. No extra-axial fluid collection. Vascular: No hyperdense vessel or unexpected calcification. Skull: Normal. Negative for fracture or focal lesion. Sinuses/Orbits: Mucosal thickening in the ethmoid air cells. The orbits are unremarkable. Other: None. ASPECTS [facility] Stroke Program Early CT Score) - Ganglionic level infarction (caudate, lentiform nuclei, internal capsule, insula, M1-M3 cortex): 7 - Supraganglionic infarction (M4-M6 cortex): 3 Total score (0-10 with 10 being normal): 10 IMPRESSION: 1. No acute intracranial process. 2. ASPECTS is 10. Code stroke imaging results were communicated on 02/21/2021 at 7:18 pm to provider [facility] via secure text paging. Electronically Signed   By: Wiliam Ke M.D.   On: 02/21/2021 19:19    Subjective: Patient reports feeling back to his baseline today.  He is ready to go home.  Discharge  Exam: Vitals:   02/23/21 0947 02/23/21 1146  BP: 119/75 (!) 108/58  Pulse: 65 (!) 58  Resp: 18 18  Temp:  (!) 97.5 F (36.4 C)  SpO2:  98%    General: Pt is alert, awake, not in acute distress Cardiovascular: RRR, S1/S2 +, no rubs, no gallops Respiratory: CTA bilaterally, no wheezing, no rhonchi Abdominal: Soft, NT, ND, bowel sounds + Extremities: no edema, no cyanosis.  Muscle wasting worse on lower extremities.  Labs: Basic Metabolic Panel: Recent Labs  Lab 02/21/21 1900 02/21/21 1907  NA 137 141  K 4.2 4.4  CL 106 107  CO2 24  --   GLUCOSE 192* 193*  BUN 23 27*  CREATININE 1.25* 1.00  CALCIUM 9.2  --    CBC: Recent Labs  Lab 02/21/21 1900 02/21/21 1907  WBC 4.7  --   NEUTROABS 3.1  --   HGB 14.7 16.0  HCT 45.5 47.0  MCV 105.1*  --   PLT 193  --     Microbiology Recent Results (from the past 240 hour(s))  Resp Panel by RT-PCR (Flu A&B, Covid) Nasopharyngeal Swab     Status: None   Collection Time: 02/21/21  7:39 PM   Specimen: Nasopharyngeal Swab; Nasopharyngeal(NP) swabs in vial transport medium  Result Value Ref Range Status   SARS Coronavirus 2 by RT PCR NEGATIVE NEGATIVE Final    Comment: (NOTE) SARS-CoV-2 target nucleic acids are NOT DETECTED.  The SARS-CoV-2 RNA is generally detectable in upper respiratory specimens during the acute phase of infection. The lowest concentration of SARS-CoV-2 viral copies this assay can detect is 138 copies/mL. A negative result does not preclude SARS-Cov-2 infection and should not be used as the sole basis for treatment or other patient management decisions. A negative result may occur with  improper specimen collection/handling, submission of specimen other than nasopharyngeal swab, presence of viral mutation(s) within the areas targeted by this assay, and inadequate number of viral copies(<138 copies/mL). A negative result must be combined with clinical observations, patient history, and  epidemiological information. The expected result is Negative.  Fact Sheet for Patients:  BloggerCourse.com  Fact Sheet for Healthcare Providers:  SeriousBroker.it  This test is no t yet approved or cleared by the [place] FDA and  has been authorized for detection and/or diagnosis of SARS-CoV-2 by FDA under an Emergency Use Authorization (EUA). This EUA will remain  in effect (meaning this test can be used) for the duration of the COVID-19 declaration under Section 564(b)(1) of the Act, 21 U.S.C.section 360bbb-3(b)(1), unless the authorization is terminated  or revoked sooner.       Influenza A by PCR NEGATIVE NEGATIVE Final   Influenza B by PCR NEGATIVE NEGATIVE Final    Comment: (NOTE) The Xpert Xpress SARS-CoV-2/FLU/RSV plus assay is intended as an aid in the diagnosis of influenza from Nasopharyngeal swab specimens and should not be used as a sole basis for treatment. Nasal washings and aspirates are unacceptable for Xpert Xpress SARS-CoV-2/FLU/RSV testing.  Fact Sheet for Patients: BloggerCourse.com  Fact Sheet for Healthcare Providers: SeriousBroker.it  This test is not yet approved or cleared by the [place] FDA and has been authorized for detection and/or diagnosis of SARS-CoV-2 by FDA under an Emergency Use Authorization (EUA). This EUA will remain in effect (meaning this test can be used) for the duration of the COVID-19 declaration under Section 564(b)(1) of the Act, 21 U.S.C. section 360bbb-3(b)(1), unless the authorization is terminated or revoked.  Performed at [facility], [address] [place], [place] [address]     Time coordinating discharge: Over 30 minutes  Leeroy Bock, MD  [facility] 02/23/2021, 12:23 PM Pager   If 7PM-7AM, please contact night-coverage [email or web address] Password [email or web address]

## 2021-02-23 NOTE — TOC Transition Note (Addendum)
Transition of Care [facility]) - CM/SW Discharge Note   Patient Details  Name: Jobany Montellano MRN: [id number] Date of Birth: [birth year]  [facility] CM/SW Contact:  Lawerance Sabal, RN Phone Number: 02/23/2021, 2:11 PM   Clinical Narrative:    Spoke w patient at bedside. He states that he has a cane at home, discussed recs from PT OT evals. Ordered RW to room for patient to take home at DC. He is agreeable to [facility] PT, does not have a preference for agency. [facility] declined, staffing [facility]- unable to staff until Wednesday [facility]- no response [facility]- able to accept    Final next level of care: Home w Home Health Services Barriers to Discharge: No Barriers Identified   Patient Goals and CMS Choice Patient states their goals for this hospitalization and ongoing recovery are:: to go home CMS Medicare.gov Compare Post Acute Care list provided to:: Patient Choice offered to / list presented to : Patient  Discharge Placement                       Discharge Plan and Services   Discharge Planning Services: CM Consult            DME Arranged: Dan Humphreys rolling DME Agency: [employer] Date DME Agency Contacted: 02/23/21 Time DME Agency Contacted: [phone] Representative spoke with at DME Agency: Francesco Sor Arranged: PT          Social Determinants of Health (SDOH) Interventions     Readmission Risk Interventions No flowsheet data found.

## 2021-02-23 NOTE — Evaluation (Signed)
Physical Therapy Evaluation Patient Details Name: Zachary Tucker MRN: [id number] DOB: 1951-01-31 Today's Date: 02/23/2021  History of Present Illness  Pt is 70 yo male who presents with R facial droop and loss of coordination on L. MRI and CT neg for acute CVA. PMH: HIV, L CVA 2019  Clinical Impression  Pt admitted with above diagnosis. Pt received sitting in small chair by window speaking with animation on phone with a friend who could take him home. History taken in same position and pt cognitively in tact, denies pain and dizziness and with normal strength and sensation. Pt relays that he has had episodes of passing out and near passing out >1 year and that is why he retired. 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 When pt got up he became dizzy within 5' of ambulation, went from supervision to needing mod A with use of RW for ataxic gait and then instructed to sit due to persistent dizziness and decreased safety. At that point pt very slow of speech, appeared rigid in his body and eyes jumping when trying to focus. Pt improved somewhat once back in bed but still not back to baseline when this therapist left room with OT and RN present. Pt reports his meds were held for tooth extraction last week and he has felt unwell since that time. Do not feel safe with him going home today.  Pt currently with functional limitations due to the deficits listed below (see PT Problem List). Pt will benefit from skilled PT to increase their independence and safety with mobility to allow discharge to the venue listed below.      BP with initial sitting 110/87                          After 3 mins sitting 105/94                           After 10 mins sitting 123/85 Pt relayed posterior HA base of skull at this time that ceased as BP returned to normal     Recommendations for follow up therapy are one component of a multi-disciplinary  discharge planning process, led by the attending physician.  Recommendations may be updated based on patient status, additional functional criteria and insurance authorization.  Follow Up Recommendations Home health PT    Equipment Recommendations  Rolling walker with 5" wheels    Recommendations for Other Services OT consult     Precautions / Restrictions Precautions Precautions: Fall Precaution Comments: Orthostatic+ with episodes where he either passes out or almost passes out and takes 30+ mins to recover Restrictions Weight Bearing Restrictions: No      Mobility  Bed Mobility Overal bed mobility: Modified Independent             General bed mobility comments: pt able to return to supine independently but was starting to improve with BP by that point    Transfers Overall transfer level: Needs assistance Equipment used: None Transfers: Sit to/from [employer] Sit to Stand: Min assist Stand pivot transfers: Min assist       General transfer comment: initially stood mod I, no difficulty. After episode required min A for safety to stand from chair and pivot to bed  Ambulation/Gait Ambulation/Gait assistance: Mod assist;Supervision;+2 safety/equipment Gait Distance (Feet): 15 Feet Assistive device: Rolling walker (2  wheeled) Gait Pattern/deviations: Step-through pattern;Ataxic;Trunk flexed Gait velocity: decreased Gait velocity interpretation: <1.8 ft/sec, indicate of risk for recurrent falls General Gait Details: pt began ambulation without AD and upright posture but quickly became dizzy and flexed at trunk, had ataxic gait, was very unsafe. Gave pt RW which helped safety but dizziness persisted and pt instructed to sit in chair  Stairs            Wheelchair Mobility    Modified Rankin (Stroke Patients Only) Modified Rankin (Stroke Patients Only) Pre-Morbid Rankin Score: No significant disability Modified Rankin: Moderately severe  disability     Balance Overall balance assessment: Needs assistance Sitting-balance support: Feet unsupported;No upper extremity supported Sitting balance-Leahy Scale: Fair Sitting balance - Comments: when pt was sitting before getting up, balance was normal. After episode he needed supervision as he was not steady even in sitting   Standing balance support: Bilateral upper extremity supported Standing balance-Leahy Scale: Poor Standing balance comment: needed mod A to prevent fall when dizzy                             Pertinent Vitals/Pain Pain Assessment: 0-10 Pain Score: 4  Faces Pain Scale: Hurts little more Pain Location: posterior head/ base of neck Pain Descriptors / Indicators: Headache Pain Intervention(s): Limited activity within patient's tolerance;Monitored during session    Home Living Family/patient expects to be discharged to:: Private residence Living Arrangements: Non-relatives/Friends Available Help at Discharge: Available PRN/intermittently Type of Home: House Home Access: Stairs to enter Entrance Stairs-Rails: Right;Left;Can reach both Entrance Stairs-Number of Steps: 6 Home Layout: Multi-level;Able to live on main level with bedroom/bathroom Home Equipment: [employer] Mor - single point Additional Comments: uses no AD in house, [facility] out of house. Does not drive because he started passing out    Prior Function Level of Independence: Independent with assistive device(s)         Comments: independent but has been limited by episodes of passing out. 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 They can help him but are not family. He has 2 sons but they live in other towns.     Hand Dominance   Dominant Hand: Left    Extremity/Trunk Assessment   Upper Extremity Assessment Upper Extremity Assessment: Defer to OT evaluation;LUE  deficits/detail LUE Deficits / Details: was moving LUE well until episode began LUE Sensation: decreased proprioception LUE Coordination: decreased fine motor    Lower Extremity Assessment Lower Extremity Assessment: Generalized weakness (tested as normal but weakness notable with ambulation)    Cervical / Trunk Assessment Cervical / Trunk Assessment: Normal Cervical / Trunk Exceptions: Pt stooped over when ambulating, most likely due to pt feeling very "out of it" with increased fatigue, reaching to hold on to furniture.  Communication   Communication:  (slow and decreased speech while dizzy)  Cognition Arousal/Alertness: Awake/alert;Lethargic Behavior During Therapy: WFL for tasks assessed/performed Overall Cognitive Status: Impaired/Different from baseline Area of Impairment: Following commands;Problem solving;Awareness;Safety/judgement                       Following Commands: Follows multi-step commands with increased time;Follows one step commands with increased time Safety/Judgement: Decreased awareness of deficits Awareness: Emergent Problem Solving: Slow processing;Requires verbal cues General Comments: pt completely in tact beginning of session but once he got up and dizziness began,  he became lethargic, slow of speech and movement, had difficulty problem solving and seemed rigid in his body. Eyes jumping as well and with periodic fixed gaze      General Comments General comments (skin integrity, edema, etc.): RN called and present. Pt reports that episode today is familiar to him with his previous episides. They do not happen daily but sometimes several times in a week. Of note, he reports that he had his teeth extracted on 10/17 and meds had been held ~3 days before that and he has been feeling unwell ever since.    Exercises     Assessment/Plan    PT Assessment Patient needs continued PT services  PT Problem List Decreased balance;Decreased  coordination;Decreased cognition;Decreased mobility;Pain;Cardiopulmonary status limiting activity       PT Treatment Interventions DME instruction;Gait training;Stair training;Functional mobility training;Therapeutic activities;Therapeutic exercise;Balance training;Neuromuscular re-education;Cognitive remediation;Patient/family education    PT Goals (Current goals can be found in the Care Plan section)  Acute Rehab PT Goals Patient Stated Goal: figure out why these episodes are happening PT Goal Formulation: With patient Time For Goal Achievement: 03/09/21 Potential to Achieve Goals: Fair    Frequency Min 3X/week   Barriers to discharge Decreased caregiver support      Co-evaluation               AM-PAC PT "6 Clicks" Mobility  Outcome Measure Help needed turning from your back to your side while in a flat bed without using bedrails?: None Help needed moving from lying on your back to sitting on the side of a flat bed without using bedrails?: None Help needed moving to and from a bed to a chair (including a wheelchair)?: A Little Help needed standing up from a chair using your arms (e.g., wheelchair or bedside chair)?: A Little Help needed to walk in hospital room?: A Lot Help needed climbing 3-5 steps with a railing? : Total 6 Click Score: 17    End of Session Equipment Utilized During Treatment: Gait belt Activity Tolerance: Treatment limited secondary to medical complications (Comment) Patient left: in bed;with call bell/phone within reach;with bed alarm set;with nursing/sitter in room Nurse Communication: Mobility status PT Visit Diagnosis: Unsteadiness on feet (R26.81);Ataxic gait (R26.0);Pain;Dizziness and giddiness (R42) Pain - part of body:  (head)    Time: 9150-5697 PT Time Calculation (min) (ACUTE ONLY): 51 min   Charges:   PT Evaluation $PT Eval Moderate Complexity: 1 Mod PT Treatments $Gait Training: 8-22 mins $Therapeutic Activity: 8-22 mins         Lyanne Co, PT  Acute Rehab Services  Pager [phone] Office [phone]   Lawana Chambers Aubryn Spinola 02/23/2021, 10:42 AM

## 2021-02-23 NOTE — Evaluation (Addendum)
Occupational Therapy Evaluation Patient Details Name: Zachary Tucker MRN: [id number] DOB: 11-04-1950 Today's Date: 02/23/2021   History of Present Illness Pt is 70 yo male who presents with R facial droop and loss of coordination on L. MRI and CT neg for acute CVA. PMH: HIV, L CVA 2019.   Clinical Impression   Pt admitted with concerns listed above. PTA pt reported that typically he was independent with all ADL's, however he has had multiple episodes of passing out/nearly passing out, which caused him to retire and stop driving. This session pt was initially independent, however when he started to ambulate he back "light headed" and reported that he felt that he was close to passing out. Pt requiring increased assist during episode. He became less talkative, his color blanched, and his vision showed horizontal nystagmus. Once back in bed, pt's body became rigid/tense with slow movements when cued to move. Due to this episode, pt will need further assessment to insure safety with returning home independently. OT will follow up acutely.      Recommendations for follow up therapy are one component of a multi-disciplinary discharge planning process, led by the attending physician.  Recommendations may be updated based on patient status, additional functional criteria and insurance authorization.   Follow Up Recommendations  No OT follow up    Equipment Recommendations  None recommended by OT    Recommendations for Other Services       Precautions / Restrictions Precautions Precautions: Fall Precaution Comments: Orthostatic+ with episodes where he either passes out or almost passes out and takes 30+ mins to recover Restrictions Weight Bearing Restrictions: No      Mobility Bed Mobility Overal bed mobility: Modified Independent             General bed mobility comments: pt able to return to supine independently but was starting to improve with BP by that point     Transfers Overall transfer level: Needs assistance Equipment used: None Transfers: Sit to/from [employer] Sit to Stand: Min assist Stand pivot transfers: Min assist       General transfer comment: initially stood mod I, no difficulty. After episode required min A for safety to stand from chair and pivot to bed    Balance Overall balance assessment: Needs assistance Sitting-balance support: Feet unsupported;No upper extremity supported Sitting balance-Leahy Scale: Fair Sitting balance - Comments: when pt was sitting before getting up, balance was normal. After episode he needed supervision as he was not steady even in sitting   Standing balance support: Bilateral upper extremity supported Standing balance-Leahy Scale: Poor Standing balance comment: needed mod A to prevent fall when dizzy                           ADL either performed or assessed with clinical judgement   ADL Overall ADL's : Needs assistance/impaired                                       General ADL Comments: Pt overall at min guard level during his episode, due to safety and increased weakness. prior to the episode, pt was independent     Vision Baseline Vision/History: 1 Wears glasses Ability to See in Adequate Light: 1 Impaired Patient Visual Report: No change from baseline Vision Assessment?: Vision impaired- to be further tested in functional context Additional Comments: Nystagmus noted in  both eyes Horizontally during episode for 20+ mins. Pt with difficulty looking at OT/PT/RN without verbal cues and increased time during episode. Gaze fixed at times.     Perception Perception Perception Tested?: No   Praxis Praxis Praxis tested?: Not tested    Pertinent Vitals/Pain Pain Assessment: Faces Pain Score: 4  Faces Pain Scale: Hurts little more Pain Location: Back of head/headache Pain Descriptors / Indicators: Headache Pain Intervention(s): Monitored  during session     Hand Dominance Left   Extremity/Trunk Assessment Upper Extremity Assessment Upper Extremity Assessment: LUE deficits/detail LUE Deficits / Details: Minimally weaker and more ataxic than his RUE. MMT 4/5. LUE Sensation: WNL LUE Coordination: decreased fine motor   Lower Extremity Assessment Lower Extremity Assessment: Defer to PT evaluation   Cervical / Trunk Assessment Cervical / Trunk Assessment: Other exceptions Cervical / Trunk Exceptions: Pt stooped over when ambulating, most likely due to pt feeling very "out of it" with increased fatigue, reaching to hold on to furniture.   Communication Communication Communication: No difficulties   Cognition Arousal/Alertness: Awake/alert Behavior During Therapy: WFL for tasks assessed/performed Overall Cognitive Status: Within Functional Limits for tasks assessed Area of Impairment: Following commands;Problem solving;Awareness;Safety/judgement                       Following Commands: Follows multi-step commands with increased time;Follows one step commands with increased time Safety/Judgement: Decreased awareness of deficits Awareness: Emergent Problem Solving: Slow processing;Requires verbal cues General Comments: Pt more responsive with PT initially, when OT entered pt started to ambulate and become less talkative and reported he felt close to passing out.   General Comments  RN called and present. Pt reports that episode today is familiar to him with his previous episides. They do not happen daily but sometimes several times in a week. Of note, he reports that he had his teeth extracted on 10/17 and meds had been held ~3 days before that and he has been feeling unwell ever since.    Exercises     Shoulder Instructions      Home Living Family/patient expects to be discharged to:: Private residence Living Arrangements: Non-relatives/Friends Available Help at Discharge: Available  PRN/intermittently Type of Home: House Home Access: Stairs to enter [employer] of Steps: 6 Entrance Stairs-Rails: Right;Left;Can reach both Home Layout: Multi-level;Able to live on main level with bedroom/bathroom     Bathroom Shower/Tub: Producer, television/film/video: Standard     Home Equipment: Gil[employer] Mor - single point   Additional Comments: uses no AD in house, [facility] out of house. Does not drive because he started passing out      Prior Functioning/Environment Level of Independence: Independent with assistive device(s)        Comments: independent but has been limited by episodes of passing out        OT Problem List: Decreased strength;Decreased activity tolerance;Impaired balance (sitting and/or standing);Impaired vision/perception;Decreased coordination      OT Treatment/Interventions: Self-care/ADL training;Therapeutic exercise;Energy conservation;DME and/or AE instruction;Therapeutic activities;Patient/family education;Balance training    OT Goals(Current goals can be found in the care plan section) Acute Rehab OT Goals Patient Stated Goal: figure out why these episodes are happening OT Goal Formulation: With patient Time For Goal Achievement: 03/09/21 Potential to Achieve Goals: Good ADL Goals Pt Will Perform Grooming: Independently;standing Pt Will Perform Lower Body Bathing: Independently;sitting/lateral leans;sit to/from stand Pt Will Perform Lower Body Dressing: Independently;sitting/lateral leans;sit to/from stand Pt Will Transfer to Toilet: Independently;ambulating Pt Will Perform Toileting -  Clothing Manipulation and hygiene: Independently;sit to/from stand;sitting/lateral leans Additional ADL Goal #1: Pt will complete a higher level pathfinding task with independence.  OT Frequency: Min 2X/week   Barriers to D/C:            Co-evaluation              AM-PAC OT "6 Clicks" Daily Activity     Outcome Measure Help from another person  eating meals?: None Help from another person taking care of personal grooming?: A Little Help from another person toileting, which includes using toliet, bedpan, or urinal?: A Little Help from another person bathing (including washing, rinsing, drying)?: A Little Help from another person to put on and taking off regular upper body clothing?: A Little Help from another person to put on and taking off regular lower body clothing?: A Little 6 Click Score: 19   End of Session Equipment Utilized During Treatment: Gait belt;Rolling walker Nurse Communication: Mobility status;Other (comment) (Pt with increased dizziness/feeling like he was going to pass out/ less talkative)  Activity Tolerance: Patient tolerated treatment well;Other (comment) (Initally good, with episode increased fatigue') Patient left: in bed;with call bell/phone within reach;with bed alarm set;with nursing/sitter in room  OT Visit Diagnosis: Unsteadiness on feet (R26.81);Other abnormalities of gait and mobility (R26.89);Muscle weakness (generalized) (M62.81)                Time: 2035-5974 OT Time Calculation (min): 33 min Charges:  OT General Charges $OT Visit: 1 Visit OT Evaluation $OT Eval Moderate Complexity: 1 Mod OT Treatments $Therapeutic Activity: 8-22 mins  Jaece Ducharme H., OTR/L Acute Rehabilitation  Ariely Riddell Elane Hermenia Fritcher 02/23/2021, 11:13 AM

## 2021-02-24 DIAGNOSIS — Z20822 Contact with and (suspected) exposure to covid-19: Secondary | ICD-10-CM | POA: Diagnosis present

## 2021-02-24 DIAGNOSIS — E43 Unspecified severe protein-calorie malnutrition: Secondary | ICD-10-CM | POA: Diagnosis present

## 2021-02-24 DIAGNOSIS — Z7901 Long term (current) use of anticoagulants: Secondary | ICD-10-CM | POA: Diagnosis not present

## 2021-02-24 DIAGNOSIS — I10 Essential (primary) hypertension: Secondary | ICD-10-CM | POA: Diagnosis present

## 2021-02-24 DIAGNOSIS — E042 Nontoxic multinodular goiter: Secondary | ICD-10-CM | POA: Diagnosis present

## 2021-02-24 DIAGNOSIS — Z79899 Other long term (current) drug therapy: Secondary | ICD-10-CM | POA: Diagnosis not present

## 2021-02-24 DIAGNOSIS — I69354 Hemiplegia and hemiparesis following cerebral infarction affecting left non-dominant side: Secondary | ICD-10-CM | POA: Diagnosis not present

## 2021-02-24 DIAGNOSIS — I951 Orthostatic hypotension: Secondary | ICD-10-CM | POA: Diagnosis present

## 2021-02-24 DIAGNOSIS — R270 Ataxia, unspecified: Secondary | ICD-10-CM | POA: Diagnosis present

## 2021-02-24 DIAGNOSIS — Z21 Asymptomatic human immunodeficiency virus [HIV] infection status: Secondary | ICD-10-CM | POA: Diagnosis present

## 2021-02-24 DIAGNOSIS — R1310 Dysphagia, unspecified: Secondary | ICD-10-CM | POA: Diagnosis present

## 2021-02-24 DIAGNOSIS — Z7982 Long term (current) use of aspirin: Secondary | ICD-10-CM | POA: Diagnosis not present

## 2021-02-24 DIAGNOSIS — Z6822 Body mass index (BMI) 22.0-22.9, adult: Secondary | ICD-10-CM | POA: Diagnosis not present

## 2021-02-24 DIAGNOSIS — G459 Transient cerebral ischemic attack, unspecified: Secondary | ICD-10-CM | POA: Diagnosis present

## 2021-02-24 DIAGNOSIS — R29810 Facial weakness: Secondary | ICD-10-CM | POA: Diagnosis present

## 2021-02-24 DIAGNOSIS — I480 Paroxysmal atrial fibrillation: Secondary | ICD-10-CM | POA: Diagnosis present

## 2021-02-24 MED ORDER — ACETAMINOPHEN 325 MG PO TABS
650.0000 mg | ORAL_TABLET | Freq: Four times a day (QID) | ORAL | Status: DC | PRN
  Administered 2021-02-24: 650 mg via ORAL
  Filled 2021-02-24: qty 2

## 2021-02-24 MED ORDER — SODIUM CHLORIDE 0.9 % IV SOLN: INTRAVENOUS | Status: DC

## 2021-02-24 MED ORDER — METOPROLOL TARTRATE 12.5 MG HALF TABLET
12.5000 mg | ORAL_TABLET | Freq: Two times a day (BID) | ORAL | Status: DC
  Administered 2021-02-24: 12.5 mg via ORAL
  Filled 2021-02-24: qty 1

## 2021-02-24 NOTE — Progress Notes (Signed)
Physical Therapy Treatment Patient Details Name: Zachary Tucker MRN: [id number] DOB: 24-Jun-1950 Today's Date: 02/24/2021   History of Present Illness Pt is 70 yo male who presents with R facial droop and loss of coordination on L. MRI and CT neg for acute CVA. PMH: HIV, L CVA 2019.    PT Comments    Pt with slow progression towards goals. Pt very lethargic and with positive orthostatics so mobility limited. On intial attempt to sit up, pt very lethargic and pt falling asleep in sittin. Returned pt to supine to check BP. BP at 123/74. Had pt sit again and pt falling asleep, requiring min A to maintain balance. BP in sitting at 89/66. Returned to supine. BP in supine at 109/59. Notified RN. Anticipate pt will progress well once BP and lethargy improve, however, if he does not improve, may need to consider post acute rehab. Will continue to follow acutely.    Recommendations for follow up therapy are one component of a multi-disciplinary discharge planning process, led by the attending physician.  Recommendations may be updated based on patient status, additional functional criteria and insurance authorization.  Follow Up Recommendations  Home health PT;Supervision for mobility/OOB (pending progression)     Equipment Recommendations  Rolling walker with 5" wheels    Recommendations for Other Services       Precautions / Restrictions Precautions Precautions: Fall Precaution Comments: Orthostatic+ with episodes where he either passes out or almost passes out and takes 30+ mins to recover Restrictions Weight Bearing Restrictions: No     Mobility  Bed Mobility Overal bed mobility: Needs Assistance Bed Mobility: Supine to Sit;Sit to Supine     Supine to sit: Min guard Sit to supine: Min guard   General bed mobility comments: Min guard A for safety to come to sitting X2. On intial attempt, pt very lethargic and pt falling asleep in sitting. Returned pt to supine to check BP. BP at  123/74. Had pt sit again and pt falling asleep, requiring min A to maintain balance. BP in sitting at 89/66. Returned to supine. BP in supine at 109/59. Notified RN.    Transfers                    Ambulation/Gait                 Stairs             Wheelchair Mobility    Modified Rankin (Stroke Patients Only)       Balance Overall balance assessment: Needs assistance Sitting-balance support: Feet unsupported;No upper extremity supported Sitting balance-Leahy Scale: Poor Sitting balance - Comments: Reliant on min A for sitting balance secondary to lethargy                                    Cognition Arousal/Alertness: Lethargic Behavior During Therapy: Flat affect Overall Cognitive Status: No family/caregiver present to determine baseline cognitive functioning                                 General Comments: Pt very lethargic this session. Would respond when provoked, but otherwise falling asleep very easily throughout.      Exercises      General Comments        Pertinent Vitals/Pain Pain Assessment: Faces Faces Pain Scale: Hurts little more Pain Location:  Back of head/headache Pain Descriptors / Indicators: Headache Pain Intervention(s): Limited activity within patient's tolerance;Monitored during session;Repositioned    Home Living Family/patient expects to be discharged to:: Private residence Living Arrangements: Non-relatives/Friends Available Help at Discharge: Available PRN/intermittently Type of Home: House Home Access: Stairs to enter Entrance Stairs-Rails: Right;Left;Can reach both Home Layout: Multi-level;Able to live on main level with bedroom/bathroom Home Equipment: Gilmer Mor - single point Additional Comments: uses no AD in house, [facility] out of house. Does not drive because he started passing out    Prior Function Level of Independence: Independent with assistive device(s)      Comments:  independent but has been limited by episodes of passing out   PT Goals (current goals can now be found in the care plan section) Acute Rehab PT Goals Patient Stated Goal: figure out why these episodes are happening PT Goal Formulation: With patient Time For Goal Achievement: 03/09/21 Potential to Achieve Goals: Fair Progress towards PT goals: Not progressing toward goals - comment (secondary to +orthostatics)    Frequency    Min 3X/week      PT Plan Current plan remains appropriate (pending progression)    Co-evaluation              AM-PAC PT "6 Clicks" Mobility   Outcome Measure  Help needed turning from your back to your side while in a flat bed without using bedrails?: A Little Help needed moving from lying on your back to sitting on the side of a flat bed without using bedrails?: A Little Help needed moving to and from a bed to a chair (including a wheelchair)?: A Lot Help needed standing up from a chair using your arms (e.g., wheelchair or bedside chair)?: A Lot Help needed to walk in hospital room?: A Lot Help needed climbing 3-5 steps with a railing? : A Lot 6 Click Score: 14    End of Session   Activity Tolerance: Patient limited by lethargy;Treatment limited secondary to medical complications (Comment) (Orthostatics) Patient left: in bed;with call bell/phone within reach;with bed alarm set Nurse Communication: Mobility status PT Visit Diagnosis: Unsteadiness on feet (R26.81);Ataxic gait (R26.0);Dizziness and giddiness (R42)     Time: 1610-9604 PT Time Calculation (min) (ACUTE ONLY): 15 min  Charges:  $Therapeutic Activity: 8-22 mins                     Cindee Salt, DPT  Acute Rehabilitation Services  Pager: [phone] Office: [phone]    Lehman Prom 02/24/2021, 10:48 AM

## 2021-02-24 NOTE — Progress Notes (Signed)
PROGRESS NOTE    Zachary Tucker  EAV:[id number] DOB: 1950/06/14 DOA: 02/21/2021 PCP: [facility]     Brief Narrative:  Zachary Tucker is a 70 yo male who presented to ED as code stroke for acute onset of right facial droop and loss of coordination on the left-hand side.  Was outside of the window for tPA treatment and on Eliquis chronically.  He had brief holding of his Eliquis last week due to a dental procedure.  Head CT and brain MRI/MRA were negative for acute intracranial abnormality.  His symptoms spontaneously resolved throughout admission.  Neurology was consulted for evaluation.    New events last 24 hours / Subjective: Patient was supposed to discharge back home yesterday, however when working with PT he had an episode of dizziness, slow of speech, rigidity in his body.  Discharge was held off.  He was not orthostatic yesterday during PT.  This morning, when working with PT, patient was noted to be very lethargic and orthostatic was positive.  Metoprolol was held.  Patient's blood pressure 123/74 supine and 89/66 sitting.  Patient was very slow to answer questions but had no focal deficits, remained generalized weakness.  He was alert and oriented to person, place, year 2023.  Assessment & Plan:   Principal Problem:   TIA (transient ischemic attack) Active Problems:   HIV (human immunodeficiency virus infection) (HCC)   Malnutrition of moderate degree   Thyroid nodule   TIA -Presented with right facial droop, loss of coordination on the left -CT head negative -MRI brain negative -MRA head and neck negative -Neurology consulted -Continue aspirin, Eliquis, statin -PT OT SLP  Orthostatic hypotension -Hold metoprolol this morning, resume this afternoon at a lower dose -Give IV fluids today and recheck orthostatic vital signs in the morning  Paroxysmal A. fib -Continue Eliquis, metoprolol at a lower dose  HIV -Resume home meds  Multiple  thyroid nodules -Follow-up with FNA as outpatient   DVT prophylaxis:  Place TED hose Start: 02/22/21 1930 apixaban (ELIQUIS) tablet 5 mg  Code Status: Full Family Communication: None at bedside Disposition Plan:  Status is: Observation  The patient will require care spanning > 2 midnights and should be moved to inpatient because: Start IVF today for orthostatic hypotension      Consultants:  Neurology  Procedures:  None   Antimicrobials:  Anti-infectives (From admission, onward)    Start     Dose/Rate Route Frequency Ordered Stop   02/22/21 1745  abacavir-dolutegravir-lamiVUDine (TRIUMEQ) 600-50-300 MG per tablet 1 tablet        1 tablet Oral Daily 02/22/21 1654          Objective: Vitals:   02/24/21 0259 02/24/21 0724 02/24/21 1020 02/24/21 1101  BP: 113/78 111/71 128/74 102/67  Pulse: 60 (!) 55 63 61  Resp: 18 18  17   Temp: 98.8 F (37.1 C) 98.3 F (36.8 C)  97.6 F (36.4 C)  TempSrc: Oral Oral  Oral  SpO2: 95% 98% 99% 97%  Weight:      Height:        Intake/Output Summary (Last 24 hours) at 02/24/2021 1333 Last data filed at 02/24/2021 1243 Gross per 24 hour  Intake 240 ml  Output 650 ml  Net -410 ml   Filed Weights   02/21/21 1924 02/22/21 1320  Weight: 86 kg 82.6 kg    Examination: General exam: Appears calm and comfortable, lethargic and fatigued Respiratory system: Clear to auscultation. Respiratory effort normal. No respiratory distress.  No conversational dyspnea.  Cardiovascular system: S1 & S2 heard, RRR. No murmurs. No pedal edema. Gastrointestinal system: Abdomen is nondistended, soft and nontender. Normal bowel sounds heard. Central nervous system: Alert and oriented. No focal neurological deficits. Speech clear but slow to answer.  Weakness observed all 4 extremities, nonfocal. Extremities: Symmetric in appearance  Skin: No rashes, lesions or ulcers on exposed skin   Data Reviewed: I have personally reviewed following labs and  imaging studies  CBC: Recent Labs  Lab 02/21/21 1900 02/21/21 1907  WBC 4.7  --   NEUTROABS 3.1  --   HGB 14.7 16.0  HCT 45.5 47.0  MCV 105.1*  --   PLT 193  --    Basic Metabolic Panel: Recent Labs  Lab 02/21/21 1900 02/21/21 1907  NA 137 141  K 4.2 4.4  CL 106 107  CO2 24  --   GLUCOSE 192* 193*  BUN 23 27*  CREATININE 1.25* 1.00  CALCIUM 9.2  --    GFR: Estimated Creatinine Clearance: 81.5 mL/min (by C-G formula based on SCr of 1 mg/dL). Liver Function Tests: Recent Labs  Lab 02/21/21 1900  AST 37  ALT 23  ALKPHOS 71  BILITOT 1.0  PROT 7.7  ALBUMIN 3.9   No results for input(s): LIPASE, AMYLASE in the last 168 hours. No results for input(s): AMMONIA in the last 168 hours. Coagulation Profile: Recent Labs  Lab 02/21/21 1900  INR 1.5*   Cardiac Enzymes: No results for input(s): CKTOTAL, CKMB, CKMBINDEX, TROPONINI in the last 168 hours. BNP (last 3 results) No results for input(s): PROBNP in the last 8760 hours. HbA1C: Recent Labs    02/22/21 0853  HGBA1C 5.9*   CBG: Recent Labs  Lab 02/21/21 1900 02/21/21 1927  GLUCAP 190* 178*   Lipid Profile: Recent Labs    02/22/21 0853  CHOL 115  HDL 33*  LDLCALC 69  TRIG 64  CHOLHDL 3.5   Thyroid Function Tests: Recent Labs    02/22/21 0435  TSH 2.135   Anemia Panel: No results for input(s): VITAMINB12, FOLATE, FERRITIN, TIBC, IRON, RETICCTPCT in the last 72 hours. Sepsis Labs: No results for input(s): PROCALCITON, LATICACIDVEN in the last 168 hours.  Recent Results (from the past 240 hour(s))  Resp Panel by RT-PCR (Flu A&B, Covid) Nasopharyngeal Swab     Status: None   Collection Time: 02/21/21  7:39 PM   Specimen: Nasopharyngeal Swab; Nasopharyngeal(NP) swabs in vial transport medium  Result Value Ref Range Status   SARS Coronavirus 2 by RT PCR NEGATIVE NEGATIVE Final    Comment: (NOTE) SARS-CoV-2 target nucleic acids are NOT DETECTED.  The SARS-CoV-2 RNA is generally detectable  in upper respiratory specimens during the acute phase of infection. The lowest concentration of SARS-CoV-2 viral copies this assay can detect is 138 copies/mL. A negative result does not preclude SARS-Cov-2 infection and should not be used as the sole basis for treatment or other patient management decisions. A negative result may occur with  improper specimen collection/handling, submission of specimen other than nasopharyngeal swab, presence of viral mutation(s) within the areas targeted by this assay, and inadequate number of viral copies(<138 copies/mL). A negative result must be combined with clinical observations, patient history, and epidemiological information. The expected result is Negative.  Fact Sheet for Patients:  BloggerCourse.com  Fact Sheet for Healthcare Providers:  SeriousBroker.it  This test is no t yet approved or cleared by the [place] FDA and  has been authorized for detection and/or diagnosis of  SARS-CoV-2 by FDA under an Emergency Use Authorization (EUA). This EUA will remain  in effect (meaning this test can be used) for the duration of the COVID-19 declaration under Section 564(b)(1) of the Act, 21 U.S.C.section 360bbb-3(b)(1), unless the authorization is terminated  or revoked sooner.       Influenza A by PCR NEGATIVE NEGATIVE Final   Influenza B by PCR NEGATIVE NEGATIVE Final    Comment: (NOTE) The Xpert Xpress SARS-CoV-2/FLU/RSV plus assay is intended as an aid in the diagnosis of influenza from Nasopharyngeal swab specimens and should not be used as a sole basis for treatment. Nasal washings and aspirates are unacceptable for Xpert Xpress SARS-CoV-2/FLU/RSV testing.  Fact Sheet for Patients: BloggerCourse.com  Fact Sheet for Healthcare Providers: SeriousBroker.it  This test is not yet approved or cleared by the [place] FDA and has  been authorized for detection and/or diagnosis of SARS-CoV-2 by FDA under an Emergency Use Authorization (EUA). This EUA will remain in effect (meaning this test can be used) for the duration of the COVID-19 declaration under Section 564(b)(1) of the Act, 21 U.S.C. section 360bbb-3(b)(1), unless the authorization is terminated or revoked.  Performed at [facility], [address], [place], [place] [address]       Radiology Studies: US THYROID  Result Date: 02/22/2021 CLINICAL DATA:  Thyroid nodule EXAM: THYROID ULTRASOUND TECHNIQUE: Ultrasound examination of the thyroid gland and adjacent soft tissues was performed. COMPARISON:  None. FINDINGS: Parenchymal Echotexture: Moderately heterogeneous Isthmus: 0.5 cm Right lobe: 7.4 x 3.6 x 5.7 cm Left lobe: 8.7 x 3.5 x 4.2 cm _________________________________________________________ Estimated total number of nodules >/= 1 cm: 7 Number of spongiform nodules >/=  2 cm not described below (TR1): 0 Number of mixed cystic and solid nodules >/= 1.5 cm not described below (TR2): 0 _________________________________________________________ Nodule # 1: Location: Right; superior Maximum size: 4.9 cm; Other 2 dimensions: 4.2 x 3.3 cm Composition: solid/almost completely solid (2) Echogenicity: isoechoic (1) Shape: not taller-than-wide (0) Margins: smooth (0) Echogenic foci: none (0) ACR TI-RADS total points: 3. ACR TI-RADS risk category: TR3 (3 points). ACR TI-RADS recommendations: **Given size (>/= 2.5 cm) and appearance, fine needle aspiration of this mildly suspicious nodule should be considered based on TI-RADS criteria. _________________________________________________________ Nodule # 2: Location: Right; mid Maximum size: 2.3 cm; Other 2 dimensions: 1.8 x 2.3 cm Composition: solid/almost completely solid (2) Echogenicity: isoechoic (1) Shape: not taller-than-wide (0) Margins: smooth (0) Echogenic foci: none (0) ACR TI-RADS total points: 3. ACR TI-RADS  risk category: TR3 (3 points). ACR TI-RADS recommendations: *Given size (>/= 1.5 - 2.4 cm) and appearance, a follow-up ultrasound in 1 year should be considered based on TI-RADS criteria. _________________________________________________________ Nodule # 3: Location: Right; inferior Maximum size: 1.2 cm; Other 2 dimensions: 1.1 x 1.2 cm Composition: solid/almost completely solid (2) Echogenicity: isoechoic (1) Shape: not taller-than-wide (0) Margins: smooth (0) Echogenic foci: none (0) ACR TI-RADS total points: 3. ACR TI-RADS risk category: TR3 (3 points). ACR TI-RADS recommendations: Given size (<1.4 cm) and appearance, this nodule does NOT meet TI-RADS criteria for biopsy or dedicated follow-up. _________________________________________________________ Nodule # 4: Location: Left; superior Maximum size: 2.0 cm; Other 2 dimensions: 1.4 x 1.4 cm Composition: solid/almost completely solid (2) Echogenicity: isoechoic (1) Shape: not taller-than-wide (0) Margins: smooth (0) Echogenic foci: none (0) ACR TI-RADS total points: 3. ACR TI-RADS risk category: TR3 (3 points). ACR TI-RADS recommendations: *Given size (>/= 1.5 - 2.4 cm) and appearance, a follow-up ultrasound in 1 year should be  considered based on TI-RADS criteria. _________________________________________________________ Nodule # 5: Location: Left; mid Maximum size: 1.8 cm; Other 2 dimensions: 1.5 x 1.7 cm Composition: mixed cystic and solid (1) Echogenicity: isoechoic (1) Shape: not taller-than-wide (0) Margins: smooth (0) Echogenic foci: none (0) ACR TI-RADS total points: 2. ACR TI-RADS risk category: TR2 (2 points). ACR TI-RADS recommendations: This nodule does NOT meet TI-RADS criteria for biopsy or dedicated follow-up. _________________________________________________________ Nodule # 6: Location: Left; superior Maximum size: 1.8 cm; Other 2 dimensions: 1.3 x 1.6 cm Composition: solid/almost completely solid (2) Echogenicity: isoechoic (1) Shape: not  taller-than-wide (0) Margins: smooth (0) Echogenic foci: none (0) ACR TI-RADS total points: 3. ACR TI-RADS risk category: TR3 (3 points). ACR TI-RADS recommendations: *Given size (>/= 1.5 - 2.4 cm) and appearance, a follow-up ultrasound in 1 year should be considered based on TI-RADS criteria. _________________________________________________________ Nodule # 7: Location: Left; inferior Maximum size: 2.2 cm; Other 2 dimensions: 1.6 x 1.8 cm Composition: solid/almost completely solid (2) Echogenicity: isoechoic (1) Shape: not taller-than-wide (0) Margins: smooth (0) Echogenic foci: none (0) ACR TI-RADS total points: 3. ACR TI-RADS risk category: TR3 (3 points). ACR TI-RADS recommendations: *Given size (>/= 1.5 - 2.4 cm) and appearance, a follow-up ultrasound in 1 year should be considered based on TI-RADS criteria. _________________________________________________________ IMPRESSION: 1. Nodule 1 (TI-RADS 3) located in the superior right thyroid lobe meets criteria for FNA. 2. Nodules 2, 4, 6, and 7 meet criteria for imaging follow-up. Annual ultrasound surveillance is recommended until 5 years of stability is documented. The above is in keeping with the ACR TI-RADS recommendations - J Am Coll Radiol 2017;14:587-595. Electronically Signed   By: Acquanetta Belling M.D.   On: 02/22/2021 16:48      Scheduled Meds:  abacavir-dolutegravir-lamiVUDine  1 tablet Oral Daily   apixaban  5 mg Oral BID   aspirin EC  81 mg Oral Daily   atorvastatin  20 mg Oral Daily   feeding supplement  237 mL Oral TID BM   metoprolol tartrate  12.5 mg Oral BID   multivitamin with minerals  1 tablet Oral Daily   Continuous Infusions:  sodium chloride 100 mL/hr at 02/24/21 1243     LOS: 0 days      Time spent: 30 minutes   Noralee Stain, DO [facility] 02/24/2021, 1:33 PM   Available via Epic secure chat 7am-7pm After these hours, please refer to coverage provider listed on [email or web address]

## 2021-02-24 NOTE — Plan of Care (Signed)
Pt is alert oriented x 4. , ambulatory. Pt did not have any syncopal episodes last night, pt denies pain. No distress noted.     Problem: Education: Goal: Knowledge of General Education information will improve Description: Including pain rating scale, medication(s)/side effects and non-pharmacologic comfort measures Outcome: Progressing   Problem: Health Behavior/Discharge Planning: Goal: Ability to manage health-related needs will improve Outcome: Progressing   Problem: Clinical Measurements: Goal: Ability to maintain clinical measurements within normal limits will improve Outcome: Progressing Goal: Will remain free from infection Outcome: Progressing Goal: Diagnostic test results will improve Outcome: Progressing Goal: Respiratory complications will improve Outcome: Progressing Goal: Cardiovascular complication will be avoided Outcome: Progressing   Problem: Activity: Goal: Risk for activity intolerance will decrease Outcome: Progressing   Problem: Nutrition: Goal: Adequate nutrition will be maintained Outcome: Progressing   Problem: Coping: Goal: Level of anxiety will decrease Outcome: Progressing   Problem: Elimination: Goal: Will not experience complications related to bowel motility Outcome: Progressing Goal: Will not experience complications related to urinary retention Outcome: Progressing   Problem: Pain Managment: Goal: General experience of comfort will improve Outcome: Progressing   Problem: Safety: Goal: Ability to remain free from injury will improve Outcome: Progressing   Problem: Skin Integrity: Goal: Risk for impaired skin integrity will decrease Outcome: Progressing   Problem: Health Behavior/Discharge Planning: Goal: Ability to manage health-related needs will improve Outcome: Progressing   Problem: Ischemic Stroke/TIA Tissue Perfusion: Goal: Complications of ischemic stroke/TIA will be minimized Outcome: Progressing

## 2021-02-25 DIAGNOSIS — G459 Transient cerebral ischemic attack, unspecified: Secondary | ICD-10-CM | POA: Diagnosis not present

## 2021-02-25 LAB — CBC
HCT: 40.6 % (ref 39.0–52.0)
Hemoglobin: 13.5 g/dL (ref 13.0–17.0)
MCH: 34.2 pg — ABNORMAL HIGH (ref 26.0–34.0)
MCHC: 33.3 g/dL (ref 30.0–36.0)
MCV: 102.8 fL — ABNORMAL HIGH (ref 80.0–100.0)
Platelets: 184 10*3/uL (ref 150–400)
RBC: 3.95 MIL/uL — ABNORMAL LOW (ref 4.22–5.81)
RDW: 12.5 % (ref 11.5–15.5)
WBC: 5.5 10*3/uL (ref 4.0–10.5)
nRBC: 0 % (ref 0.0–0.2)

## 2021-02-25 LAB — BASIC METABOLIC PANEL
Anion gap: 8 (ref 5–15)
BUN: 18 mg/dL (ref 8–23)
CO2: 23 mmol/L (ref 22–32)
Calcium: 9.1 mg/dL (ref 8.9–10.3)
Chloride: 106 mmol/L (ref 98–111)
Creatinine, Ser: 0.97 mg/dL (ref 0.61–1.24)
GFR, Estimated: >60 mL/min (ref >60)
Glucose, Bld: 110 mg/dL — ABNORMAL HIGH (ref 70–99)
Potassium: 4 mmol/L (ref 3.5–5.1)
Sodium: 137 mmol/L (ref 135–145)

## 2021-02-25 MED ORDER — METOPROLOL TARTRATE 25 MG PO TABS: 12.5000 mg | ORAL_TABLET | Freq: Two times a day (BID) | ORAL | 0 refills | Status: AC

## 2021-02-25 NOTE — Progress Notes (Signed)
Physical Therapy Treatment Patient Details Name: Zachary Tucker MRN: [id number] DOB: 10-09-1950 Today's Date: 02/25/2021   History of Present Illness Pt is 71 yo male who presents with R facial droop and loss of coordination on L. MRI and CT neg for acute CVA. PMH: HIV, L CVA 2019.    PT Comments    Pt progressing towards physical therapy goals. Pt reports he is near baseline of function, however continues to appear very flat. Orthostatics taken and listed below. Pt reports he will be alone during the day at times, however feel he could benefit from family/friends checking in on him a little more often these first few days home. Will continue to follow.   Orthostatic BPs  Supine 123/72  Sitting 112/76  Standing 106/66  Standing after 15 min activity 119/77      Recommendations for follow up therapy are one component of a multi-disciplinary discharge planning process, led by the attending physician.  Recommendations may be updated based on patient status, additional functional criteria and insurance authorization.  Follow Up Recommendations  Home health PT     Assistance Recommended at Discharge Frequent or constant Supervision/Assistance  Equipment Recommendations  Rolling walker (2 wheels)    Recommendations for Other Services       Precautions / Restrictions Precautions Precautions: Fall Restrictions Weight Bearing Restrictions: No     Mobility  Bed Mobility Overal bed mobility: Modified Independent Bed Mobility: Supine to Sit;Sit to Supine           General bed mobility comments: No assist required. Pt able to tranisition to/from EOB with HOB flat and min use of rails.    Transfers Overall transfer level: Needs assistance Equipment used: None Transfers: Sit to/from Stand Sit to Stand: Supervision           General transfer comment: Light supervision provided for safety. No assist. Initially stood to no AD and no unsteadiness noted, and then stood to RW  as we were anticipating ambulation.    Ambulation/Gait Ambulation/Gait assistance: Min guard;Supervision Gait Distance (Feet): 200 Feet (x2) Assistive device: Rolling walker (2 wheels) Gait Pattern/deviations: Step-through pattern;Trunk flexed;Narrow base of support Gait velocity: Decreased Gait velocity interpretation: <1.31 ft/sec, indicative of household ambulator General Gait Details: VC's throughout for improved posture, closer walker proximity, and forward gaze. Extremely slow gait speed however pt reports this is near baseline for him. No overt LOB noted. Min guard assist initially progressing to supervision for safety by end of gait training. Pt ambulated to the rehab gym, took a seated rest break, completed stair training, and then ambulated back to the room without another seated rest break required.   Stairs Stairs: Yes Stairs assistance: Min guard Stair Management: One rail Right;Step to pattern;Forwards Number of Stairs: 2 (x3) General stair comments: VC's for sequencing and general safety. No assist required however hands on guarding provided for safety. Appeared unsteady and with soft knees during ascent and descent.   Wheelchair Mobility    Modified Rankin (Stroke Patients Only) Modified Rankin (Stroke Patients Only) Pre-Morbid Rankin Score: No significant disability Modified Rankin: Moderately severe disability     Balance Overall balance assessment: Needs assistance Sitting-balance support: Feet unsupported;No upper extremity supported Sitting balance-Leahy Scale: Fair     Standing balance support: No upper extremity supported Standing balance-Leahy Scale: Fair Standing balance comment: Pt able to static stand with minimal sway for standing orthostatic BP measurement. No assist required.  Cognition Arousal/Alertness: Awake/alert Behavior During Therapy: Flat affect Overall Cognitive Status: No family/caregiver present  to determine baseline cognitive functioning Area of Impairment: Problem solving                             Problem Solving: Slow processing          Exercises      General Comments        Pertinent Vitals/Pain Pain Assessment: Faces Faces Pain Scale: Hurts a little bit Pain Location: Back of head/headache Pain Descriptors / Indicators: Headache Pain Intervention(s): Monitored during session    Home Living Family/patient expects to be discharged to:: Private residence Living Arrangements: Non-relatives/Friends Available Help at Discharge: Available PRN/intermittently Type of Home: House Home Access: Stairs to enter Entrance Stairs-Rails: Right;Left;Can reach both Entrance Stairs-Number of Steps: 6   Home Layout: Multi-level;Able to live on main level with bedroom/bathroom Home Equipment: [employer] Mor - single point Additional Comments: uses no AD in house, [facility] out of house. Does not drive because he started passing out    Prior Function            PT Goals (current goals can now be found in the care plan section) Acute Rehab PT Goals Patient Stated Goal: figure out why these episodes are happening PT Goal Formulation: With patient Time For Goal Achievement: 03/09/21 Potential to Achieve Goals: Fair Progress towards PT goals: Progressing toward goals    Frequency    Min 3X/week      PT Plan Current plan remains appropriate    Co-evaluation              AM-PAC PT "6 Clicks" Mobility   Outcome Measure  Help needed turning from your back to your side while in a flat bed without using bedrails?: None Help needed moving from lying on your back to sitting on the side of a flat bed without using bedrails?: None Help needed moving to and from a bed to a chair (including a wheelchair)?: A Little Help needed standing up from a chair using your arms (e.g., wheelchair or bedside chair)?: A Little Help needed to walk in hospital room?: A Little Help  needed climbing 3-5 steps with a railing? : A Little 6 Click Score: 20    End of Session Equipment Utilized During Treatment: Gait belt Activity Tolerance: Patient tolerated treatment well Patient left: in bed;with call bell/phone within reach;with bed alarm set (Bed in chair position.) Nurse Communication: Mobility status PT Visit Diagnosis: Unsteadiness on feet (R26.81);Ataxic gait (R26.0);Dizziness and giddiness (R42) Pain - part of body:  (head)     Time: 1010-1053 PT Time Calculation (min) (ACUTE ONLY): 43 min  Charges:  $Gait Training: 38-52 mins                     Zachary Tucker, PT, DPT Acute Rehabilitation Services Pager: [phone] Office: [phone]    Zachary Tucker 02/25/2021, 12:44 PM

## 2021-02-25 NOTE — Discharge Summary (Signed)
Physician Discharge Summary  Zachary Tucker HBZ:[id number] DOB: 19-Jun-1950 DOA: 02/21/2021  PCP: [facility]  Admit date: 02/21/2021 Discharge date: 02/25/2021  Admitted From: Home Disposition:  Home  Recommendations for Outpatient Follow-up:  Follow up with PCP in 1 week Follow-up outpatient for multiple thyroid nodules  Discharge Condition: Stable CODE STATUS: Full code Diet recommendation: Heart healthy diet / dysphagia 3 diet   Brief/Interim Summary: Zachary Tucker is a 70 yo male who presented to ED as code stroke for acute onset of right facial droop and loss of coordination on the left-hand side.  Was outside of the window for tPA treatment and on Eliquis chronically.  He had brief holding of his Eliquis last week due to a dental procedure.  Head CT and brain MRI/MRA were negative for acute intracranial abnormality.  His symptoms spontaneously resolved throughout admission.  Neurology was consulted for evaluation. Hospitalization further prolonged due to orthostatic hypotension which resolved on morning of discharge.  Patient was back to his baseline and ready for discharge home.  Discharge Diagnoses:  Principal Problem:   TIA (transient ischemic attack) Active Problems:   HIV (human immunodeficiency virus infection) (HCC)   Malnutrition of moderate degree   Thyroid nodule   TIA -Presented with right facial droop, loss of coordination on the left -CT head negative -MRI brain negative -MRA head and neck negative -Neurology consulted -Continue aspirin, Eliquis, statin -PT OT SLP evaluation completed  Orthostatic hypotension -Resolved with IV fluids   Paroxysmal A. fib -Continue Eliquis, metoprolol at a lower dose   HIV -Resume home meds   Multiple thyroid nodules -Follow-up with FNA as outpatient    Discharge Instructions   Allergies as of 02/25/2021       Reactions   Celebrex [celecoxib] Other (See Comments)   Constant runny nose    Shellfish Allergy         Medication List     STOP taking these medications    VITAMIN E PO       TAKE these medications    aspirin 81 MG EC tablet Take 1 tablet (81 mg total) by mouth daily. Swallow whole.   atorvastatin 20 MG tablet Commonly known as: LIPITOR Take 20 mg by mouth daily.   Eliquis 5 MG Tabs tablet Generic drug: apixaban Take 5 mg by mouth 2 (two) times daily.   Ensure Plus Liqd Take 237 mLs by mouth daily. Strawberry/vanilla   metoprolol tartrate 25 MG tablet Commonly known as: LOPRESSOR Take 25 mg by mouth 2 (two) times daily.   polyvinyl alcohol 1.4 % ophthalmic solution Commonly known as: LIQUIFILM TEARS Place 1 drop into both eyes as needed for dry eyes.   PRESERVISION AREDS 2 PO Take 1 capsule by mouth daily.   Triumeq 600-50-300 MG tablet Generic drug: abacavir-dolutegravir-lamiVUDine Take 1 tablet by mouth daily.               Durable Medical Equipment  (From admission, onward)           Start     Ordered   02/23/21 1345  For home use only DME Walker rolling  Once       Question Answer Comment  Walker: With 5 Inch Wheels   Patient needs a walker to treat with the following condition Weakness      02/23/21 1345            Follow-up Information     [facility] Follow [facility].   Specialty: Home  Health Services Why: For home health physical therapy. They will contact you in 1-2 days to set up your first home appointment. Contact information: [address] [place] [address] [phone]                Allergies  Allergen Reactions   Celebrex [Celecoxib] Other (See Comments)    Constant runny nose   Shellfish Allergy     Consultations: Neurology    Procedures/Studies: MR ANGIO HEAD WO CONTRAST  Result Date: 02/22/2021 CLINICAL DATA:  70 year old male code stroke presentation. Left arm and right face deficits. EXAM: MRA HEAD WITHOUT CONTRAST TECHNIQUE: Angiographic  images of the Circle of Willis were acquired using MRA technique without intravenous contrast. COMPARISON:  Brain MRI and neck MRA today. FINDINGS: Antegrade flow in the posterior circulation with dominant distal right vertebral artery (normal variant), better demonstrated on the postcontrast neck MRA today. The non dominant left vertebral artery remains patent to the vertebrobasilar junction. Normal right PICA origin. No vertebrobasilar junction or basilar artery stenosis. Patent SCA origins. Normal right PCA origin. Fetal type left PCA origin. Right posterior communicating artery diminutive or absent. Bilateral PCA branches are within normal limits. Antegrade flow in both ICA siphons. No siphon stenosis. Normal ophthalmic and left posterior communicating artery origins. Patent carotid termini. Dominant right and diminutive left ACA A1 segments (normal variant). Anterior communicating artery appears normal. Visible bilateral ACA branches are within normal limits. MCA origins, M1 segments, bifurcations and visible bilateral MCA branches are within normal limits. IMPRESSION: Negative intracranial MRA. Electronically Signed   By: Odessa Fleming M.D.   On: 02/22/2021 05:58   MR Angiogram Neck W or Wo Contrast  Result Date: 02/22/2021 CLINICAL DATA:  70 year old male code stroke presentation. Left arm and right face deficits. EXAM: MRA NECK WITHOUT AND WITH CONTRAST TECHNIQUE: Multiplanar and multiecho pulse sequences of the neck were obtained without and with intravenous contrast. Angiographic images of the neck were obtained using MRA technique without and with intravenous contrast. CONTRAST:  100mL GADAVIST GADOBUTROL 1 MMOL/ML IV SOLN COMPARISON:  Brain MRI and intracranial MRA today. FINDINGS: Precontrast time-of-flight images demonstrate antegrade flow signal in the bilateral cervical carotid and vertebral arteries. The right vertebral appears dominant. Carotid bifurcations appear grossly normal. Following contrast 3  vessel arch configuration is noted. Right CCA is within normal limits. Right carotid bifurcation, right ICA origin and bulb appear normal. Negative cervical right ICA, and visible right ICA siphon, terminus. Left CCA is within normal limits. Negative left carotid bifurcation, cervical left ICA, and visible left ICA siphon. Prominent left posterior communicating artery (normal variant). Dominant right vertebral artery. Normal right vertebral artery origin. The left vertebral has an early origin (normal variant) which is not as well visualized. Both vertebrals are patent to the skull base. No vertebral artery stenosis identified. IMPRESSION: Negative MRA Neck. Electronically Signed   By: Odessa Fleming M.D.   On: 02/22/2021 05:55   MR Brain Wo Contrast (neuro protocol)  Result Date: 02/22/2021 CLINICAL DATA:  70 year old male code stroke presentation. Left arm and right face deficits. EXAM: MRI HEAD WITHOUT CONTRAST TECHNIQUE: Multiplanar, multiecho pulse sequences of the brain and surrounding structures were obtained without intravenous contrast. COMPARISON:  Plain head CT 02/21/2021. [facility] Brain MRI 10/05/2017. FINDINGS: Brain: No restricted diffusion to suggest acute infarction. No midline shift, mass effect, evidence of mass lesion, ventriculomegaly, extra-axial collection or acute intracranial hemorrhage. Cervicomedullary junction and pituitary are within normal limits. Chronic  asymmetric left cerebellar hemisphere atrophy and/or encephalomalacia is stable (series 17, image 11). And there is associated chronic microhemorrhage in the left cerebellar peduncle and perhaps affecting some of the deep left cerebellar nuclei (series 14, image 15). But no other chronic cerebral blood products. And elsewhere gray and white matter signal remains largely normal for age. There is mild chronic scattered nonspecific, mostly subcortical white matter T2 and FLAIR hyperintensity. No  cerebral cortex encephalomalacia identified. Deep gray matter nuclei and brainstem remain within normal limits. Vascular: Major intracranial vascular flow voids are stable since 2019. See also MRA reported separately. Skull and upper cervical spine: Negative for age visible cervical spine. Normal bone marrow signal. Sinuses/Orbits: Rightward gaze deviation similar to 2019. Otherwise negative orbits. Stable paranasal sinus mucosal thickening. Other: Chronic right mastoid effusion. Negative visible nasopharynx. Other visible internal auditory structures appear grossly normal. Negative visible scalp and face. IMPRESSION: 1. No acute intracranial abnormality. 2. 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 3. Otherwise mild for age nonspecific white matter signal changes. Electronically Signed   By: Odessa Fleming M.D.   On: 02/22/2021 05:52   ECHOCARDIOGRAM COMPLETE  Result Date: 02/22/2021    ECHOCARDIOGRAM REPORT   Patient Name:   MARGARITA BOBROWSKI Date of Exam: 02/22/2021 Medical Rec #:  [id number]     Height:       77.0 in Accession #:    [id number]    Weight:       189.6 lb Date of Birth:  1951/02/19     BSA:          2.187 m Patient Age:    70 years      BP:           125/64 mmHg Patient Gender: M             HR:           65 bpm. Exam Location:  Inpatient Procedure: 2D Echo, Cardiac Doppler and Color Doppler Indications:    TIA  History:        Patient has no prior history of Echocardiogram examinations.  Sonographer:    Melton Krebs RDCS, FE, PE Referring Phys: [id number] JINDONG XU IMPRESSIONS  1. Left ventricular ejection fraction, by estimation, is 60 to 65%. The left ventricle has normal function. The left ventricle has no regional wall motion abnormalities. Left ventricular diastolic parameters are consistent with Grade I diastolic dysfunction (impaired relaxation).  2. Right ventricular systolic function is normal. The right ventricular size is normal.  3. The mitral valve is normal in  structure. No evidence of mitral valve regurgitation. No evidence of mitral stenosis.  4. The aortic valve is tricuspid. Aortic valve regurgitation is not visualized. Mild aortic valve sclerosis is present, with no evidence of aortic valve stenosis.  5. The inferior vena cava is normal in size with greater than 50% respiratory variability, suggesting right atrial pressure of 3 mmHg. FINDINGS  Left Ventricle: Left ventricular ejection fraction, by estimation, is 60 to 65%. The left ventricle has normal function. The left ventricle has no regional wall motion abnormalities. The left ventricular internal cavity size was normal in size. There is  no left ventricular hypertrophy. Left ventricular diastolic parameters are consistent with Grade I diastolic dysfunction (impaired relaxation). Right Ventricle: The right ventricular size is normal. Right ventricular systolic function is normal. Left Atrium: Left atrial size was normal in size. Right Atrium: Right atrial size was normal in size. Pericardium: There is no evidence  of pericardial effusion. Mitral Valve: The mitral valve is normal in structure. No evidence of mitral valve regurgitation. No evidence of mitral valve stenosis. Tricuspid Valve: The tricuspid valve is normal in structure. Tricuspid valve regurgitation is trivial. No evidence of tricuspid stenosis. Aortic Valve: The aortic valve is tricuspid. Aortic valve regurgitation is not visualized. Mild aortic valve sclerosis is present, with no evidence of aortic valve stenosis. Pulmonic Valve: The pulmonic valve was not well visualized. Pulmonic valve regurgitation is not visualized. No evidence of pulmonic stenosis. Aorta: The aortic root is normal in size and structure. Venous: The inferior vena cava is normal in size with greater than 50% respiratory variability, suggesting right atrial pressure of 3 mmHg. IAS/Shunts: The interatrial septum was not well visualized.  LEFT VENTRICLE PLAX 2D LVIDd:         4.20  cm   Diastology LVIDs:         2.50 cm   LV e' medial:    4.33 cm/s LV PW:         1.10 cm   LV E/e' medial:  11.6 LV IVS:        1.10 cm   LV e' lateral:   7.56 cm/s LVOT diam:     2.20 cm   LV E/e' lateral: 6.7 LV SV:         64 LV SV Index:   29 LVOT Area:     3.80 cm  RIGHT VENTRICLE RV S prime:     11.50 cm/s TAPSE (M-mode): 1.4 cm LEFT ATRIUM             Index        RIGHT ATRIUM           Index LA diam:        4.40 cm 2.01 cm/m   RA Area:     14.10 cm LA Vol (A2C):   48.5 ml 22.18 ml/m  RA Volume:   33.90 ml  15.50 ml/m LA Vol (A4C):   47.3 ml 21.63 ml/m LA Biplane Vol: 50.0 ml 22.87 ml/m  AORTIC VALVE LVOT Vmax:   78.90 cm/s LVOT Vmean:  50.200 cm/s LVOT VTI:    0.169 m  AORTA Ao Root diam: 3.40 cm MITRAL VALVE MV Area (PHT): 2.01 cm    SHUNTS MV Decel Time: 377 msec    Systemic VTI:  0.17 m MV E velocity: 50.40 cm/s  Systemic Diam: 2.20 cm MV A velocity: 51.40 cm/s MV E/A ratio:  0.98 Olga Millers MD Electronically signed by Olga Millers MD Signature Date/Time: 02/22/2021/2:07:58 PM    Final    US THYROID  Result Date: 02/22/2021 CLINICAL DATA:  Thyroid nodule EXAM: THYROID ULTRASOUND TECHNIQUE: Ultrasound examination of the thyroid gland and adjacent soft tissues was performed. COMPARISON:  None. FINDINGS: Parenchymal Echotexture: Moderately heterogeneous Isthmus: 0.5 cm Right lobe: 7.4 x 3.6 x 5.7 cm Left lobe: 8.7 x 3.5 x 4.2 cm _________________________________________________________ Estimated total number of nodules >/= 1 cm: 7 Number of spongiform nodules >/=  2 cm not described below (TR1): 0 Number of mixed cystic and solid nodules >/= 1.5 cm not described below (TR2): 0 _________________________________________________________ Nodule # 1: Location: Right; superior Maximum size: 4.9 cm; Other 2 dimensions: 4.2 x 3.3 cm Composition: solid/almost completely solid (2) Echogenicity: isoechoic (1) Shape: not taller-than-wide (0) Margins: smooth (0) Echogenic foci: none (0) ACR TI-RADS  total points: 3. ACR TI-RADS risk category: TR3 (3 points). ACR TI-RADS recommendations: **Given size (>/= 2.5 cm) and  appearance, fine needle aspiration of this mildly suspicious nodule should be considered based on TI-RADS criteria. _________________________________________________________ Nodule # 2: Location: Right; mid Maximum size: 2.3 cm; Other 2 dimensions: 1.8 x 2.3 cm Composition: solid/almost completely solid (2) Echogenicity: isoechoic (1) Shape: not taller-than-wide (0) Margins: smooth (0) Echogenic foci: none (0) ACR TI-RADS total points: 3. ACR TI-RADS risk category: TR3 (3 points). ACR TI-RADS recommendations: *Given size (>/= 1.5 - 2.4 cm) and appearance, a follow-up ultrasound in 1 year should be considered based on TI-RADS criteria. _________________________________________________________ Nodule # 3: Location: Right; inferior Maximum size: 1.2 cm; Other 2 dimensions: 1.1 x 1.2 cm Composition: solid/almost completely solid (2) Echogenicity: isoechoic (1) Shape: not taller-than-wide (0) Margins: smooth (0) Echogenic foci: none (0) ACR TI-RADS total points: 3. ACR TI-RADS risk category: TR3 (3 points). ACR TI-RADS recommendations: Given size (<1.4 cm) and appearance, this nodule does NOT meet TI-RADS criteria for biopsy or dedicated follow-up. _________________________________________________________ Nodule # 4: Location: Left; superior Maximum size: 2.0 cm; Other 2 dimensions: 1.4 x 1.4 cm Composition: solid/almost completely solid (2) Echogenicity: isoechoic (1) Shape: not taller-than-wide (0) Margins: smooth (0) Echogenic foci: none (0) ACR TI-RADS total points: 3. ACR TI-RADS risk category: TR3 (3 points). ACR TI-RADS recommendations: *Given size (>/= 1.5 - 2.4 cm) and appearance, a follow-up ultrasound in 1 year should be considered based on TI-RADS criteria. _________________________________________________________ Nodule # 5: Location: Left; mid Maximum size: 1.8 cm; Other 2 dimensions: 1.5 x  1.7 cm Composition: mixed cystic and solid (1) Echogenicity: isoechoic (1) Shape: not taller-than-wide (0) Margins: smooth (0) Echogenic foci: none (0) ACR TI-RADS total points: 2. ACR TI-RADS risk category: TR2 (2 points). ACR TI-RADS recommendations: This nodule does NOT meet TI-RADS criteria for biopsy or dedicated follow-up. _________________________________________________________ Nodule # 6: Location: Left; superior Maximum size: 1.8 cm; Other 2 dimensions: 1.3 x 1.6 cm Composition: solid/almost completely solid (2) Echogenicity: isoechoic (1) Shape: not taller-than-wide (0) Margins: smooth (0) Echogenic foci: none (0) ACR TI-RADS total points: 3. ACR TI-RADS risk category: TR3 (3 points). ACR TI-RADS recommendations: *Given size (>/= 1.5 - 2.4 cm) and appearance, a follow-up ultrasound in 1 year should be considered based on TI-RADS criteria. _________________________________________________________ Nodule # 7: Location: Left; inferior Maximum size: 2.2 cm; Other 2 dimensions: 1.6 x 1.8 cm Composition: solid/almost completely solid (2) Echogenicity: isoechoic (1) Shape: not taller-than-wide (0) Margins: smooth (0) Echogenic foci: none (0) ACR TI-RADS total points: 3. ACR TI-RADS risk category: TR3 (3 points). ACR TI-RADS recommendations: *Given size (>/= 1.5 - 2.4 cm) and appearance, a follow-up ultrasound in 1 year should be considered based on TI-RADS criteria. _________________________________________________________ IMPRESSION: 1. Nodule 1 (TI-RADS 3) located in the superior right thyroid lobe meets criteria for FNA. 2. Nodules 2, 4, 6, and 7 meet criteria for imaging follow-up. Annual ultrasound surveillance is recommended until 5 years of stability is documented. The above is in keeping with the ACR TI-RADS recommendations - J Am Coll Radiol 2017;14:587-595. Electronically Signed   By: Acquanetta Belling M.D.   On: 02/22/2021 16:48   CT HEAD CODE STROKE WO CONTRAST  Result Date: 02/21/2021 CLINICAL DATA:   Code stroke.  Left arm, right face EXAM: CT HEAD WITHOUT CONTRAST TECHNIQUE: Contiguous axial images were obtained from the base of the skull through the vertex without intravenous contrast. COMPARISON:  None. FINDINGS: Brain: No evidence of acute infarction, hemorrhage, cerebral edema, mass, mass effect, or midline shift. Ventricles and sulci are normal for age. No extra-axial fluid collection. Vascular: No hyperdense vessel  or unexpected calcification. Skull: Normal. Negative for fracture or focal lesion. Sinuses/Orbits: Mucosal thickening in the ethmoid air cells. The orbits are unremarkable. Other: None. ASPECTS [facility] Stroke Program Early CT Score) - Ganglionic level infarction (caudate, lentiform nuclei, internal capsule, insula, M1-M3 cortex): 7 - Supraganglionic infarction (M4-M6 cortex): 3 Total score (0-10 with 10 being normal): 10 IMPRESSION: 1. No acute intracranial process. 2. ASPECTS is 10. Code stroke imaging results were communicated on 02/21/2021 at 7:18 pm to provider [facility] via secure text paging. Electronically Signed   By: Wiliam Ke M.D.   On: 02/21/2021 19:19       Discharge Exam: Vitals:   02/25/21 0730 02/25/21 1126  BP: 108/71 114/66  Pulse: 67 65  Resp: 18 18  Temp: 98 F (36.7 C) 98.3 F (36.8 C)  SpO2: 94% 95%    General: Pt is alert, awake, not in acute distress Cardiovascular: RRR, S1/S2 +, no edema Respiratory: CTA bilaterally, no wheezing, no rhonchi, no respiratory distress, no conversational dyspnea  Abdominal: Soft, NT, ND, bowel sounds + Extremities: no edema, no cyanosis Psych: Normal mood and affect, stable judgement and insight     The results of significant diagnostics from this hospitalization (including imaging, microbiology, ancillary and laboratory) are listed below for reference.     Microbiology: Recent Results (from the past 240 hour(s))  Resp Panel by RT-PCR (Flu A&B, Covid) Nasopharyngeal Swab     Status: None   Collection Time:  02/21/21  7:39 PM   Specimen: Nasopharyngeal Swab; Nasopharyngeal(NP) swabs in vial transport medium  Result Value Ref Range Status   SARS Coronavirus 2 by RT PCR NEGATIVE NEGATIVE Final    Comment: (NOTE) SARS-CoV-2 target nucleic acids are NOT DETECTED.  The SARS-CoV-2 RNA is generally detectable in upper respiratory specimens during the acute phase of infection. The lowest concentration of SARS-CoV-2 viral copies this assay can detect is 138 copies/mL. A negative result does not preclude SARS-Cov-2 infection and should not be used as the sole basis for treatment or other patient management decisions. A negative result may occur with  improper specimen collection/handling, submission of specimen other than nasopharyngeal swab, presence of viral mutation(s) within the areas targeted by this assay, and inadequate number of viral copies(<138 copies/mL). A negative result must be combined with clinical observations, patient history, and epidemiological information. The expected result is Negative.  Fact Sheet for Patients:  BloggerCourse.com  Fact Sheet for Healthcare Providers:  SeriousBroker.it  This test is no t yet approved or cleared by the [place] FDA and  has been authorized for detection and/or diagnosis of SARS-CoV-2 by FDA under an Emergency Use Authorization (EUA). This EUA will remain  in effect (meaning this test can be used) for the duration of the COVID-19 declaration under Section 564(b)(1) of the Act, 21 U.S.C.section 360bbb-3(b)(1), unless the authorization is terminated  or revoked sooner.       Influenza A by PCR NEGATIVE NEGATIVE Final   Influenza B by PCR NEGATIVE NEGATIVE Final    Comment: (NOTE) The Xpert Xpress SARS-CoV-2/FLU/RSV plus assay is intended as an aid in the diagnosis of influenza from Nasopharyngeal swab specimens and should not be used as a sole basis for treatment. Nasal washings  and aspirates are unacceptable for Xpert Xpress SARS-CoV-2/FLU/RSV testing.  Fact Sheet for Patients: BloggerCourse.com  Fact Sheet for Healthcare Providers: SeriousBroker.it  This test is not yet approved or cleared by the [place] FDA and has been authorized for detection and/or diagnosis of SARS-CoV-2 by FDA  under an Emergency Use Authorization (EUA). This EUA will remain in effect (meaning this test can be used) for the duration of the COVID-19 declaration under Section 564(b)(1) of the Act, 21 U.S.C. section 360bbb-3(b)(1), unless the authorization is terminated or revoked.  Performed at [facility], [address], [place], [place] [address]      Labs: BNP (last 3 results) No results for input(s): BNP in the last 8760 hours. Basic Metabolic Panel: Recent Labs  Lab 02/21/21 1900 02/21/21 1907 02/25/21 0400  NA 137 141 137  K 4.2 4.4 4.0  CL 106 107 106  CO2 24  --  23  GLUCOSE 192* 193* 110*  BUN 23 27* 18  CREATININE 1.25* 1.00 0.97  CALCIUM 9.2  --  9.1   Liver Function Tests: Recent Labs  Lab 02/21/21 1900  AST 37  ALT 23  ALKPHOS 71  BILITOT 1.0  PROT 7.7  ALBUMIN 3.9   No results for input(s): LIPASE, AMYLASE in the last 168 hours. No results for input(s): AMMONIA in the last 168 hours. CBC: Recent Labs  Lab 02/21/21 1900 02/21/21 1907 02/25/21 0400  WBC 4.7  --  5.5  NEUTROABS 3.1  --   --   HGB 14.7 16.0 13.5  HCT 45.5 47.0 40.6  MCV 105.1*  --  102.8*  PLT 193  --  184   Cardiac Enzymes: No results for input(s): CKTOTAL, CKMB, CKMBINDEX, TROPONINI in the last 168 hours. BNP: Invalid input(s): POCBNP CBG: Recent Labs  Lab 02/21/21 1900 02/21/21 1927  GLUCAP 190* 178*   D-Dimer No results for input(s): DDIMER in the last 72 hours. Hgb A1c No results for input(s): HGBA1C in the last 72 hours. Lipid Profile No results for input(s): CHOL, HDL, LDLCALC, TRIG, CHOLHDL,  LDLDIRECT in the last 72 hours. Thyroid function studies No results for input(s): TSH, T4TOTAL, T3FREE, THYROIDAB in the last 72 hours.  Invalid input(s): FREET3 Anemia work up No results for input(s): VITAMINB12, FOLATE, FERRITIN, TIBC, IRON, RETICCTPCT in the last 72 hours. Urinalysis No results found for: COLORURINE, APPEARANCEUR, LABSPEC, PHURINE, GLUCOSEU, HGBUR, BILIRUBINUR, KETONESUR, PROTEINUR, UROBILINOGEN, NITRITE, LEUKOCYTESUR Sepsis Labs Invalid input(s): PROCALCITONIN,  WBC,  LACTICIDVEN Microbiology Recent Results (from the past 240 hour(s))  Resp Panel by RT-PCR (Flu A&B, Covid) Nasopharyngeal Swab     Status: None   Collection Time: 02/21/21  7:39 PM   Specimen: Nasopharyngeal Swab; Nasopharyngeal(NP) swabs in vial transport medium  Result Value Ref Range Status   SARS Coronavirus 2 by RT PCR NEGATIVE NEGATIVE Final    Comment: (NOTE) SARS-CoV-2 target nucleic acids are NOT DETECTED.  The SARS-CoV-2 RNA is generally detectable in upper respiratory specimens during the acute phase of infection. The lowest concentration of SARS-CoV-2 viral copies this assay can detect is 138 copies/mL. A negative result does not preclude SARS-Cov-2 infection and should not be used as the sole basis for treatment or other patient management decisions. A negative result may occur with  improper specimen collection/handling, submission of specimen other than nasopharyngeal swab, presence of viral mutation(s) within the areas targeted by this assay, and inadequate number of viral copies(<138 copies/mL). A negative result must be combined with clinical observations, patient history, and epidemiological information. The expected result is Negative.  Fact Sheet for Patients:  BloggerCourse.com  Fact Sheet for Healthcare Providers:  SeriousBroker.it  This test is no t yet approved or cleared by the [place] FDA and  has been  authorized for detection and/or diagnosis of SARS-CoV-2  by FDA under an Emergency Use Authorization (EUA). This EUA will remain  in effect (meaning this test can be used) for the duration of the COVID-19 declaration under Section 564(b)(1) of the Act, 21 U.S.C.section 360bbb-3(b)(1), unless the authorization is terminated  or revoked sooner.       Influenza A by PCR NEGATIVE NEGATIVE Final   Influenza B by PCR NEGATIVE NEGATIVE Final    Comment: (NOTE) The Xpert Xpress SARS-CoV-2/FLU/RSV plus assay is intended as an aid in the diagnosis of influenza from Nasopharyngeal swab specimens and should not be used as a sole basis for treatment. Nasal washings and aspirates are unacceptable for Xpert Xpress SARS-CoV-2/FLU/RSV testing.  Fact Sheet for Patients: BloggerCourse.com  Fact Sheet for Healthcare Providers: SeriousBroker.it  This test is not yet approved or cleared by the [place] FDA and has been authorized for detection and/or diagnosis of SARS-CoV-2 by FDA under an Emergency Use Authorization (EUA). This EUA will remain in effect (meaning this test can be used) for the duration of the COVID-19 declaration under Section 564(b)(1) of the Act, 21 U.S.C. section 360bbb-3(b)(1), unless the authorization is terminated or revoked.  Performed at [facility], [address] [place], [place] [address]      Patient was seen and examined on the day of discharge and was found to be in stable condition. Time coordinating discharge: 25 minutes including assessment and coordination of care, as well as examination of the patient.   SIGNED:  Noralee Stain, DO [facility] 02/25/2021, 11:33 AM

## 2021-02-25 NOTE — Plan of Care (Signed)
  Problem: Education: Goal: Knowledge of General Education information will improve Description: Including pain rating scale, medication(s)/side effects and non-pharmacologic comfort measures Outcome: Adequate for Discharge   Problem: Health Behavior/Discharge Planning: Goal: Ability to manage health-related needs will improve Outcome: Adequate for Discharge   Problem: Clinical Measurements: Goal: Ability to maintain clinical measurements within normal limits will improve Outcome: Adequate for Discharge Goal: Will remain free from infection Outcome: Adequate for Discharge Goal: Diagnostic test results will improve Outcome: Adequate for Discharge Goal: Respiratory complications will improve Outcome: Adequate for Discharge Goal: Cardiovascular complication will be avoided Outcome: Adequate for Discharge   Problem: Activity: Goal: Risk for activity intolerance will decrease Outcome: Progressing   Problem: Nutrition: Goal: Adequate nutrition will be maintained Outcome: Adequate for Discharge   Problem: Coping: Goal: Level of anxiety will decrease Outcome: Adequate for Discharge   Problem: Elimination: Goal: Will not experience complications related to bowel motility Outcome: Adequate for Discharge Goal: Will not experience complications related to urinary retention Outcome: Progressing   Problem: Pain Managment: Goal: General experience of comfort will improve Outcome: Adequate for Discharge   Problem: Education: Goal: Knowledge of disease or condition will improve Outcome: Adequate for Discharge Goal: Knowledge of secondary prevention will improve (SELECT ALL) Outcome: Adequate for Discharge   Problem: Health Behavior/Discharge Planning: Goal: Ability to manage health-related needs will improve Outcome: Adequate for Discharge   Problem: Ischemic Stroke/TIA Tissue Perfusion: Goal: Complications of ischemic stroke/TIA will be minimized Outcome: Progressing

## 2021-02-25 NOTE — TOC Transition Note (Signed)
Transition of Care [facility]) - CM/SW Discharge Note   Patient Details  Name: Shlomie Romig MRN: [id number] Date of Birth: Jul 31, 1950  [facility] CM/SW Contact:  Kermit Balo, RN Phone Number: 02/25/2021, 12:20 PM   Clinical Narrative:    Patient discharging home with [facility] services through [place]. Cory with [facility] aware of d/c home. Walker for home is at the bedside.  Pt states he has transport home.   Final next level of care: Home w Home Health Services Barriers to Discharge: No Barriers Identified   Patient Goals and CMS Choice Patient states their goals for this hospitalization and ongoing recovery are:: to go home CMS Medicare.gov Compare Post Acute Care list provided to:: Patient Choice offered to / list presented to : Patient  Discharge Placement                       Discharge Plan and Services   Discharge Planning Services: CM Consult            DME Arranged: Dan Humphreys rolling DME Agency: [employer] Date DME Agency Contacted: 02/23/21 Time DME Agency Contacted: [phone] Representative spoke with at DME Agency: Francesco Sor Arranged: PT          Social Determinants of Health (SDOH) Interventions     Readmission Risk Interventions No flowsheet data found.

## 2021-02-25 NOTE — Progress Notes (Signed)
Occupational Therapy Treatment Patient Details Name: Zachary Tucker MRN: [id number] DOB: [birth year] Today's Date: 02/25/2021   History of present illness Pt is 70 yo male who presents with R facial droop and loss of coordination on L. MRI and CT neg for acute CVA. PMH: HIV, L CVA 2019.   OT comments  Zachary Tucker is progressing well with plans to d/c home today. Zachary Tucker was observed moving at a slower pace throughout the session, with good safety awareness, no LOB and stated this is how he normally completes functional tasks and mobility at home. He denied any dizziness, but fatigued from activity. Pt recalled 1 fall prevention strategy indep, and was educated on several others with great understanding and recall. Pt would benefit from continued OT. D/c recommendation remains appropriate.    Recommendations for follow up therapy are one component of a multi-disciplinary discharge planning process, led by the attending physician.  Recommendations may be updated based on patient status, additional functional criteria and insurance authorization.    Follow Up Recommendations  No OT follow up    Assistance Recommended at Discharge Intermittent Supervision/Assistance        Precautions / Restrictions Precautions Precautions: Fall Restrictions Weight Bearing Restrictions: No       Mobility Bed Mobility Overal bed mobility: Modified Independent Bed Mobility: Supine to Sit;Sit to Supine           General bed mobility comments: no assist required    Transfers Overall transfer level: Needs assistance Equipment used: None Transfers: Sit to/from Stand Sit to Stand: Supervision           General transfer comment: for safety only     Balance Overall balance assessment: Needs assistance Sitting-balance support: Feet unsupported;No upper extremity supported Sitting balance-Leahy Scale: Fair     Standing balance support: No upper extremity supported Standing balance-Leahy Scale:  Fair Standing balance comment: Pt able to static stand with minimal sway for standing orthostatic BP measurement. No assist required.                           ADL either performed or assessed with clinical judgement     Vision   Vision Assessment?: Vision impaired- to be further tested in functional context Additional Comments: WFL for tasks assessed. wears glasses   Perception     Praxis      Cognition Arousal/Alertness: Awake/alert Behavior During Therapy: WFL for tasks assessed/performed Overall Cognitive Status: No family/caregiver present to determine baseline cognitive functioning Area of Impairment: Problem solving                             Problem Solving: Slow processing                  General Comments VSS on RA - pt eager to return home. pt able to recall 1 fall prevention strategy. Educated on further falls prevention stategies: all lights on at night when ambulating, use AD, no throw rugs, energy conservation, sit to shower    Pertinent Vitals/ Pain       Pain Assessment: Faces Faces Pain Scale: Hurts a little bit Pain Location: Back of head/headache Pain Descriptors / Indicators: Headache Pain Intervention(s): Limited activity within patient's tolerance  Home Living Family/patient expects to be discharged to:: Private residence Living Arrangements: Non-relatives/Friends Available Help at Discharge: Available PRN/intermittently Type of Home: House Home Access: Stairs to enter [employer] of Steps: 6  Entrance Stairs-Rails: Right;Left;Can reach both Home Layout: Multi-level;Able to live on main level with bedroom/bathroom     Bathroom Shower/Tub: Producer, television/film/video: Standard     Home Equipment: Gilmer Mor - single point   Additional Comments: uses no AD in house, [facility] out of house. Does not drive because he started passing out          Frequency  Min 2X/week        Progress Toward Goals  OT  Goals(current goals can now be found in the care plan section)  Progress towards OT goals: Progressing toward goals  Acute Rehab OT Goals OT Goal Formulation: With patient Time For Goal Achievement: 03/09/21 Potential to Achieve Goals: Good ADL Goals Pt Will Perform Grooming: Independently;standing Pt Will Perform Lower Body Bathing: Independently;sitting/lateral leans;sit to/from stand Pt Will Perform Lower Body Dressing: Independently;sitting/lateral leans;sit to/from stand Pt Will Transfer to Toilet: Independently;ambulating Pt Will Perform Toileting - Clothing Manipulation and hygiene: Independently;sit to/from stand;sitting/lateral leans Additional ADL Goal #1: Pt will complete a higher level pathfinding task with independence.  Plan Discharge plan remains appropriate       AM-PAC OT "6 Clicks" Daily Activity     Outcome Measure   Help from another person eating meals?: None Help from another person taking care of personal grooming?: A Little Help from another person toileting, which includes using toliet, bedpan, or urinal?: A Little Help from another person bathing (including washing, rinsing, drying)?: A Little Help from another person to put on and taking off regular upper body clothing?: A Little Help from another person to put on and taking off regular lower body clothing?: A Little 6 Click Score: 19    End of Session    OT Visit Diagnosis: Unsteadiness on feet (R26.81);Other abnormalities of gait and mobility (R26.89);Muscle weakness (generalized) (M62.81)   Activity Tolerance Patient tolerated treatment well   Patient Left in bed;with call bell/phone within reach   Nurse Communication Mobility status        Time: 1150-1201 OT Time Calculation (min): 11 min  Charges: OT General Charges $OT Visit: 1 Visit OT Treatments $Self Care/Home Management : 8-22 mins    Zachary Tucker A Zachary Tucker 02/25/2021, 12:54 PM

## 2021-03-25 ENCOUNTER — Encounter (HOSPITAL_COMMUNITY): Payer: Self-pay | Admitting: Radiology
